# Patient Record
Sex: Male | Born: 1955 | Race: Asian | Hispanic: No | Smoking: Never smoker
Health system: Southern US, Community
[De-identification: ages and names within clinical notes are randomized; demographics above are authoritative.]

## PROBLEM LIST (undated history)

## (undated) DIAGNOSIS — E119 Type 2 diabetes mellitus without complications: Secondary | ICD-10-CM

## (undated) DIAGNOSIS — K859 Acute pancreatitis without necrosis or infection, unspecified: Secondary | ICD-10-CM

## (undated) DIAGNOSIS — Z87442 Personal history of urinary calculi: Secondary | ICD-10-CM

## (undated) DIAGNOSIS — I10 Essential (primary) hypertension: Secondary | ICD-10-CM

## (undated) DIAGNOSIS — E7800 Pure hypercholesterolemia, unspecified: Secondary | ICD-10-CM

## (undated) HISTORY — PX: OTHER SURGICAL HISTORY: SHX169

---

## 1999-10-08 ENCOUNTER — Encounter: Admission: RE | Admit: 1999-10-08 | Discharge: 1999-10-08 | Payer: Self-pay | Admitting: Internal Medicine

## 1999-10-08 ENCOUNTER — Encounter: Payer: Self-pay | Admitting: Internal Medicine

## 2000-10-10 ENCOUNTER — Encounter: Admission: RE | Admit: 2000-10-10 | Discharge: 2000-10-10 | Payer: Self-pay | Admitting: Internal Medicine

## 2000-10-10 ENCOUNTER — Encounter: Payer: Self-pay | Admitting: Internal Medicine

## 2003-10-02 ENCOUNTER — Ambulatory Visit (HOSPITAL_COMMUNITY): Admission: RE | Admit: 2003-10-02 | Discharge: 2003-10-02 | Payer: Self-pay | Admitting: *Deleted

## 2003-10-02 ENCOUNTER — Encounter (INDEPENDENT_AMBULATORY_CARE_PROVIDER_SITE_OTHER): Payer: Self-pay | Admitting: Specialist

## 2007-01-06 ENCOUNTER — Ambulatory Visit: Payer: Self-pay | Admitting: Oncology

## 2007-01-20 LAB — CBC WITH DIFFERENTIAL/PLATELET
BASO%: 0.5 % (ref 0.0–2.0)
Basophils Absolute: 0 10*3/uL (ref 0.0–0.1)
EOS%: 0.6 % (ref 0.0–7.0)
Eosinophils Absolute: 0 10*3/uL (ref 0.0–0.5)
HCT: 46 % (ref 38.7–49.9)
HGB: 16.4 g/dL (ref 13.0–17.1)
LYMPH%: 16.4 % (ref 14.0–48.0)
MCH: 31.4 pg (ref 28.0–33.4)
MCHC: 35.7 g/dL (ref 32.0–35.9)
MCV: 87.9 fL (ref 81.6–98.0)
MONO#: 0.4 10*3/uL (ref 0.1–0.9)
MONO%: 7.2 % (ref 0.0–13.0)
NEUT#: 4.6 10*3/uL (ref 1.5–6.5)
NEUT%: 75.3 % — ABNORMAL HIGH (ref 40.0–75.0)
Platelets: 193 10*3/uL (ref 145–400)
RBC: 5.23 10*6/uL (ref 4.20–5.71)
RDW: 12.9 % (ref 11.2–14.6)
WBC: 6.1 10*3/uL (ref 4.0–10.0)
lymph#: 1 10*3/uL (ref 0.9–3.3)

## 2007-01-24 LAB — COMPREHENSIVE METABOLIC PANEL
ALT: 25 U/L (ref 0–53)
AST: 27 U/L (ref 0–37)
Albumin: 4.6 g/dL (ref 3.5–5.2)
Alkaline Phosphatase: 49 U/L (ref 39–117)
BUN: 16 mg/dL (ref 6–23)
CO2: 28 mEq/L (ref 19–32)
Calcium: 9.4 mg/dL (ref 8.4–10.5)
Chloride: 102 mEq/L (ref 96–112)
Creatinine, Ser: 1.13 mg/dL (ref 0.40–1.50)
Glucose, Bld: 115 mg/dL — ABNORMAL HIGH (ref 70–99)
Potassium: 3.9 mEq/L (ref 3.5–5.3)
Sodium: 138 mEq/L (ref 135–145)
Total Bilirubin: 0.8 mg/dL (ref 0.3–1.2)
Total Protein: 7.6 g/dL (ref 6.0–8.3)

## 2007-01-24 LAB — LACTATE DEHYDROGENASE: LDH: 143 U/L (ref 94–250)

## 2007-05-30 ENCOUNTER — Ambulatory Visit: Payer: Self-pay | Admitting: Oncology

## 2010-10-04 ENCOUNTER — Encounter: Payer: Self-pay | Admitting: Orthopedic Surgery

## 2016-10-12 ENCOUNTER — Other Ambulatory Visit: Payer: Self-pay | Admitting: Internal Medicine

## 2016-10-12 DIAGNOSIS — R109 Unspecified abdominal pain: Secondary | ICD-10-CM

## 2016-10-15 ENCOUNTER — Ambulatory Visit
Admission: RE | Admit: 2016-10-15 | Discharge: 2016-10-15 | Disposition: A | Discharge status: Home / Self Care | Payer: 59 | Source: Ambulatory Visit | Attending: Internal Medicine | Admitting: Internal Medicine

## 2016-10-15 DIAGNOSIS — R109 Unspecified abdominal pain: Secondary | ICD-10-CM

## 2019-05-07 ENCOUNTER — Emergency Department (HOSPITAL_COMMUNITY): Payer: 59

## 2019-05-07 ENCOUNTER — Other Ambulatory Visit: Payer: Self-pay

## 2019-05-07 ENCOUNTER — Inpatient Hospital Stay (HOSPITAL_COMMUNITY)
Admission: EM | Admit: 2019-05-07 | Discharge: 2019-05-22 | DRG: 438 | Disposition: A | Discharge status: Home / Self Care | Payer: 59 | Attending: Internal Medicine | Admitting: Internal Medicine

## 2019-05-07 ENCOUNTER — Encounter (HOSPITAL_COMMUNITY): Payer: Self-pay | Admitting: Emergency Medicine

## 2019-05-07 DIAGNOSIS — E7800 Pure hypercholesterolemia, unspecified: Secondary | ICD-10-CM | POA: Diagnosis present

## 2019-05-07 DIAGNOSIS — R109 Unspecified abdominal pain: Secondary | ICD-10-CM | POA: Diagnosis not present

## 2019-05-07 DIAGNOSIS — I10 Essential (primary) hypertension: Secondary | ICD-10-CM | POA: Diagnosis present

## 2019-05-07 DIAGNOSIS — R0602 Shortness of breath: Secondary | ICD-10-CM

## 2019-05-07 DIAGNOSIS — Z86711 Personal history of pulmonary embolism: Secondary | ICD-10-CM | POA: Diagnosis not present

## 2019-05-07 DIAGNOSIS — D72829 Elevated white blood cell count, unspecified: Secondary | ICD-10-CM | POA: Diagnosis not present

## 2019-05-07 DIAGNOSIS — E876 Hypokalemia: Secondary | ICD-10-CM | POA: Diagnosis present

## 2019-05-07 DIAGNOSIS — Z79899 Other long term (current) drug therapy: Secondary | ICD-10-CM

## 2019-05-07 DIAGNOSIS — K625 Hemorrhage of anus and rectum: Secondary | ICD-10-CM | POA: Diagnosis not present

## 2019-05-07 DIAGNOSIS — R0682 Tachypnea, not elsewhere classified: Secondary | ICD-10-CM | POA: Diagnosis not present

## 2019-05-07 DIAGNOSIS — Z7984 Long term (current) use of oral hypoglycemic drugs: Secondary | ICD-10-CM | POA: Diagnosis not present

## 2019-05-07 DIAGNOSIS — E785 Hyperlipidemia, unspecified: Secondary | ICD-10-CM | POA: Diagnosis present

## 2019-05-07 DIAGNOSIS — K269 Duodenal ulcer, unspecified as acute or chronic, without hemorrhage or perforation: Secondary | ICD-10-CM | POA: Diagnosis not present

## 2019-05-07 DIAGNOSIS — I2699 Other pulmonary embolism without acute cor pulmonale: Secondary | ICD-10-CM | POA: Diagnosis not present

## 2019-05-07 DIAGNOSIS — Z20828 Contact with and (suspected) exposure to other viral communicable diseases: Secondary | ICD-10-CM | POA: Diagnosis present

## 2019-05-07 DIAGNOSIS — K265 Chronic or unspecified duodenal ulcer with perforation: Secondary | ICD-10-CM | POA: Diagnosis present

## 2019-05-07 DIAGNOSIS — Z8719 Personal history of other diseases of the digestive system: Secondary | ICD-10-CM | POA: Diagnosis not present

## 2019-05-07 DIAGNOSIS — R509 Fever, unspecified: Secondary | ICD-10-CM | POA: Diagnosis not present

## 2019-05-07 DIAGNOSIS — E118 Type 2 diabetes mellitus with unspecified complications: Secondary | ICD-10-CM | POA: Diagnosis not present

## 2019-05-07 DIAGNOSIS — E119 Type 2 diabetes mellitus without complications: Secondary | ICD-10-CM | POA: Diagnosis not present

## 2019-05-07 DIAGNOSIS — R6510 Systemic inflammatory response syndrome (SIRS) of non-infectious origin without acute organ dysfunction: Secondary | ICD-10-CM | POA: Diagnosis not present

## 2019-05-07 DIAGNOSIS — I2694 Multiple subsegmental pulmonary emboli without acute cor pulmonale: Secondary | ICD-10-CM | POA: Diagnosis not present

## 2019-05-07 DIAGNOSIS — R748 Abnormal levels of other serum enzymes: Secondary | ICD-10-CM | POA: Diagnosis not present

## 2019-05-07 DIAGNOSIS — K859 Acute pancreatitis without necrosis or infection, unspecified: Principal | ICD-10-CM | POA: Diagnosis present

## 2019-05-07 DIAGNOSIS — R140 Abdominal distension (gaseous): Secondary | ICD-10-CM

## 2019-05-07 HISTORY — DX: Pure hypercholesterolemia, unspecified: E78.00

## 2019-05-07 HISTORY — DX: Essential (primary) hypertension: I10

## 2019-05-07 HISTORY — DX: Type 2 diabetes mellitus without complications: E11.9

## 2019-05-07 LAB — COMPREHENSIVE METABOLIC PANEL
ALT: 378 U/L — ABNORMAL HIGH (ref 0–44)
AST: 514 U/L — ABNORMAL HIGH (ref 15–41)
Albumin: 4 g/dL (ref 3.5–5.0)
Alkaline Phosphatase: 91 U/L (ref 38–126)
Anion gap: 9 (ref 5–15)
BUN: 15 mg/dL (ref 8–23)
CO2: 25 mmol/L (ref 22–32)
Calcium: 9.2 mg/dL (ref 8.9–10.3)
Chloride: 102 mmol/L (ref 98–111)
Creatinine, Ser: 1 mg/dL (ref 0.61–1.24)
GFR calc Af Amer: >60 mL/min (ref >60)
GFR calc non Af Amer: >60 mL/min (ref >60)
Glucose, Bld: 158 mg/dL — ABNORMAL HIGH (ref 70–99)
Potassium: 3.7 mmol/L (ref 3.5–5.1)
Sodium: 136 mmol/L (ref 135–145)
Total Bilirubin: 3.1 mg/dL — ABNORMAL HIGH (ref 0.3–1.2)
Total Protein: 7.7 g/dL (ref 6.5–8.1)

## 2019-05-07 LAB — URINALYSIS, ROUTINE W REFLEX MICROSCOPIC
Bacteria, UA: NONE SEEN
Bilirubin Urine: NEGATIVE
Glucose, UA: NEGATIVE mg/dL
Hgb urine dipstick: NEGATIVE
Ketones, ur: 5 mg/dL — AB
Leukocytes,Ua: NEGATIVE
Nitrite: NEGATIVE
Protein, ur: 30 mg/dL — AB
Specific Gravity, Urine: >1.046 — ABNORMAL HIGH (ref 1.005–1.030)
pH: 5 (ref 5.0–8.0)

## 2019-05-07 LAB — CBC
HCT: 48.3 % (ref 39.0–52.0)
Hemoglobin: 16.6 g/dL (ref 13.0–17.0)
MCH: 30.2 pg (ref 26.0–34.0)
MCHC: 34.4 g/dL (ref 30.0–36.0)
MCV: 88 fL (ref 80.0–100.0)
Platelets: 223 10*3/uL (ref 150–400)
RBC: 5.49 MIL/uL (ref 4.22–5.81)
RDW: 12.4 % (ref 11.5–15.5)
WBC: 12.6 10*3/uL — ABNORMAL HIGH (ref 4.0–10.5)
nRBC: 0 % (ref 0.0–0.2)

## 2019-05-07 LAB — SARS CORONAVIRUS 2 BY RT PCR (HOSPITAL ORDER, PERFORMED IN ~~LOC~~ HOSPITAL LAB): SARS Coronavirus 2: NEGATIVE

## 2019-05-07 LAB — LIPASE, BLOOD: Lipase: 6889 U/L — ABNORMAL HIGH (ref 11–51)

## 2019-05-07 MED ORDER — ONDANSETRON HCL 4 MG/2ML IJ SOLN
4.0000 mg | Freq: Once | INTRAMUSCULAR | Status: AC | PRN
  Administered 2019-05-07: 16:00:00 4 mg via INTRAVENOUS
  Filled 2019-05-07: qty 2

## 2019-05-07 MED ORDER — SODIUM CHLORIDE 0.9% FLUSH
3.0000 mL | Freq: Once | INTRAVENOUS | Status: AC
  Administered 2019-05-07: 3 mL via INTRAVENOUS

## 2019-05-07 MED ORDER — FENTANYL CITRATE (PF) 100 MCG/2ML IJ SOLN
50.0000 ug | INTRAMUSCULAR | Status: AC | PRN
  Administered 2019-05-07 (×2): 50 ug via INTRAVENOUS
  Filled 2019-05-07 (×2): qty 2

## 2019-05-07 MED ORDER — FENTANYL CITRATE (PF) 100 MCG/2ML IJ SOLN
100.0000 ug | Freq: Once | INTRAMUSCULAR | Status: AC
  Administered 2019-05-07: 100 ug via INTRAVENOUS
  Filled 2019-05-07: qty 2

## 2019-05-07 MED ORDER — FENTANYL CITRATE (PF) 100 MCG/2ML IJ SOLN
50.0000 ug | Freq: Once | INTRAMUSCULAR | Status: AC
  Administered 2019-05-07: 50 ug via INTRAVENOUS
  Filled 2019-05-07: qty 2

## 2019-05-07 MED ORDER — IOHEXOL 300 MG/ML  SOLN
100.0000 mL | Freq: Once | INTRAMUSCULAR | Status: AC | PRN
  Administered 2019-05-07: 100 mL via INTRAVENOUS

## 2019-05-07 MED ORDER — SODIUM CHLORIDE 0.9 % IV SOLN
Freq: Once | INTRAVENOUS | Status: AC
  Administered 2019-05-07: 23:00:00 125 mL/h via INTRAVENOUS

## 2019-05-07 NOTE — ED Provider Notes (Addendum)
[facility] Provider Note   CSN: [id number] Arrival date & time: 05/07/19  1523     History   Chief Complaint Chief Complaint  Patient presents with  . Abdominal Pain    HPI Mark Kidd is a 63 y.o. male.     Epigastric pain intermittently for 2 weeks not associated with any activity.  No previous abdominal surgery.  Past medical history hypertension, diabetes, hypercholesterolemia.  Patient was seen by his primary care doctor and given omeprazole.  Severity of pain is moderate.  Nothing makes symptoms better or worse.     Past Medical History:  Diagnosis Date  . Diabetes mellitus without complication (HCC)    pre diabetic   . High cholesterol   . Hypertension     There are no active problems to display for this patient.   History reviewed. No pertinent surgical history.      Home Medications    Prior to Admission medications   Not on File    Family History No family history on file.  Social History Social History   Tobacco Use  . Smoking status: Never Smoker  . Smokeless tobacco: Never Used  Substance Use Topics  . Alcohol use: Yes    Comment: every other day drinks beer   . Drug use: Never     Allergies   Patient has no known allergies.   Review of Systems Review of Systems  All other systems reviewed and are negative.    Physical Exam Updated Vital Signs BP (!) 147/95   Pulse 73   Resp (!) 33   Ht 5\' 11"  (1.803 m)   Wt 86.2 kg   SpO2 93%   BMI 26.50 kg/m   Physical Exam Vitals signs and nursing note reviewed.  Constitutional:      Comments: In pain  HENT:     Head: Normocephalic and atraumatic.  Eyes:     Conjunctiva/sclera: Conjunctivae normal.  Neck:     Musculoskeletal: Neck supple.  Cardiovascular:     Rate and Rhythm: Normal rate and regular rhythm.  Pulmonary:     Effort: Pulmonary effort is normal.     Breath sounds: Normal breath sounds.  Abdominal:     General: Bowel sounds are normal.    Palpations: Abdomen is soft.     Comments: Bloated, tender epigastrium  Musculoskeletal: Normal range of motion.  Skin:    General: Skin is warm and dry.  Neurological:     Mental Status: He is alert and oriented to person, place, and time.  Psychiatric:        Behavior: Behavior normal.      ED Treatments / Results  Labs (all labs ordered are listed, but only abnormal results are displayed) Labs Reviewed  LIPASE, BLOOD - Abnormal; Notable for the following components:      Result Value   Lipase 6,889 (*)    All other components within normal limits  COMPREHENSIVE METABOLIC PANEL - Abnormal; Notable for the following components:   Glucose, Bld 158 (*)    AST 514 (*)    ALT 378 (*)    Total Bilirubin 3.1 (*)    All other components within normal limits  CBC - Abnormal; Notable for the following components:   WBC 12.6 (*)    All other components within normal limits  URINALYSIS, ROUTINE W REFLEX MICROSCOPIC    EKG None  Radiology Ct Abdomen Pelvis W Contrast  Result Date: 05/07/2019 CLINICAL DATA:  Abdominal pain,  elevated LFTs, epigastric pain EXAM: CT ABDOMEN AND PELVIS WITH CONTRAST TECHNIQUE: Multidetector CT imaging of the abdomen and pelvis was performed using the standard protocol following bolus administration of intravenous contrast. CONTRAST:  100mL OMNIPAQUE IOHEXOL 300 MG/ML  SOLN COMPARISON:  Abdominal ultrasound 10/15/2016 FINDINGS: Lower chest: Atelectatic changes are present in the lung bases. Normal heart size. No pericardial effusion. Hepatobiliary: No focal liver abnormality is seen. Small amount fluid within the gallbladder fossa. No frank gallbladder wall thickening. There is a punctate calcification near the level of the duodenum but this is unclear whether not this could reflect a distal CBD/ampullary stone. Pancreas: There are extensive retroperitoneal inflammatory changes however these do not appear focally centered upon the pancreas. The pancreas only  appears only slightly edematous. No pancreatic ductal dilatation. Spleen: Normal in size without focal abnormality. Adrenals/Urinary Tract: Normal adrenal glands. Mild bilateral nonspecific perinephric stranding, a nonspecific finding though may correlate with either age or decreased renal function. Kidneys are otherwise unremarkable, without renal calculi, suspicious lesion, or hydronephrosis. Bladder is unremarkable. Stomach/Bowel: Distal esophagus is normal. The gastric antrum appears slightly thickened. There is mucosal hyperenhancement circumferential thickening of the second and third portions of the duodenal sweep with a questionable focus of mural hypoattenuation (2/45) which may reflect a perforated duodenal ulcer. Remainder of the small bowel is unremarkable. A normal appendix is visualized. No colonic dilatation or wall thickening. Vascular/Lymphatic: Atherosclerotic plaque within the normal caliber aorta. Reactive adenopathy in the upper abdomen. No suspicious or enlarged lymph nodes in the included lymphatic chains. Reproductive: The prostate and seminal vesicles are unremarkable. Other: No abdominopelvic free fluid or free gas. No bowel containing hernias. Musculoskeletal: Multilevel degenerative changes are present in the imaged portions of the spine. Findings are maximal at L5-S1 with slight retrolisthesis. Features of Baastrup's disease. IMPRESSION: 1. Extensive retroperitoneal inflammatory changes appear largely centered upon the thickened and hyperenhancing second and third portions of the duodenal sweep with a questionable focus of mural hypoattenuation (2/45) which is highly suspicious for a perforated duodenal ulcer. 2. There are additional peripancreatic inflammatory features which could reflect a reactive inflammatory process from the adjacent duodenal inflammation or an intrinsic acute edematous pancreatitis. 3. Small amount of pericholecystic fluid without gallbladder wall thickening or  visible intraluminal gallstones. A punctate calcification is seen in the duodenum near the ampulla, unclear if this could reflect an ampullary stone. Could consider further evaluation with right upper quadrant ultrasound. 4. Mild bilateral nonspecific perinephric stranding, a nonspecific finding though may correlate with either age or decreased renal function. 5. Aortic Atherosclerosis (ICD10-I70.0). Electronically Signed   By: Kreg ShropshirePrice  DeHay M.D.   On: 05/07/2019 20:53    Procedures Procedures (including critical care time)  Medications Ordered in ED Medications  fentaNYL (SUBLIMAZE) injection 50 mcg (50 mcg Intravenous Given 05/07/19 1600)  sodium chloride flush (NS) 0.9 % injection 3 mL (3 mLs Intravenous Given 05/07/19 1601)  ondansetron (ZOFRAN) injection 4 mg (4 mg Intravenous Given 05/07/19 1601)  fentaNYL (SUBLIMAZE) injection 50 mcg (50 mcg Intravenous Given 05/07/19 1733)  fentaNYL (SUBLIMAZE) injection 100 mcg (100 mcg Intravenous Given 05/07/19 1928)  iohexol (OMNIPAQUE) 300 MG/ML solution 100 mL (100 mLs Intravenous Contrast Given 05/07/19 2005)     Initial Impression / Assessment and Plan / ED Course  I have reviewed the triage vital signs and the nursing notes.  Pertinent labs & imaging results that were available during my care of the patient were reviewed by me and considered in my medical decision making (see chart  for details).        Epigastric pain for 2 weeks.  White count minimally elevated. Lipase greater than 6000 with associated pan elevation of liver functions.  Alkaline phosphatase normal.  CT scan abnormal.  Will discuss with general surgery and gastroenterology at [facility].   It was recommended by the local specialists to transfer the patient to [facility].  I discussed the case with general surgery and gastroenterology at [facility]  CRITICAL CARE Performed by: Nat Christen Total critical care time: 80 minutes Critical care time was exclusive of separately  billable procedures and treating other patients. Critical care was necessary to treat or prevent imminent or life-threatening deterioration. Critical care was time spent personally by me on the following activities: development of treatment plan with patient and/or surrogate as well as nursing, discussions with consultants, evaluation of patient's response to treatment, examination of patient, obtaining history from patient or surrogate, ordering and performing treatments and interventions, ordering and review of laboratory studies, ordering and review of radiographic studies, pulse oximetry and re-evaluation of patient's condition. Final Clinical Impressions(s) / ED Diagnoses   Final diagnoses:  Elevated lipase    ED Discharge Orders    None       Nat Christen, MD 05/07/19 2126    Nat Christen, MD 05/07/19 [phone]

## 2019-05-07 NOTE — ED Triage Notes (Signed)
Epigastric pain x 2 weeks ago.  Seen at pcp, had ekg and blood work, given omeprazole.  No CT or ultrasound done.

## 2019-05-07 NOTE — H&P (Signed)
TRH H&P    Patient Demographics:    Mark Kidd, is a 63 y.o. male  MRN: [id number]  DOB - Apr 26, 1956  Admit Date - 05/07/2019  Referring MD/NP/PA: Nat Christen  Outpatient Primary MD for the patient is Lorene Dy, MD  Patient coming from: Home  Chief complaint-abdominal pain   HPI:    Mark Kidd  is a 63 y.o. male, with history of diabetes mellitus type 2, hyperlipidemia, hypertension who came to hospital with epigastric pain which started last night.  Patient says that pain was very severe and kind of eased off around 1 AM.  In the morning though he felt better.  As soon as he ate food the pain returned after 1 hour.  He was also sweating and in extreme discomfort due to pain.  Patient says that he had a similar pain around August 10 at that time he was seen by his PCP and was prescribed omeprazole which did not help the pain.  He had one episode of vomiting in the hospital. He denies vomiting any blood. Denies black stool. Denies blood in the stool. Denies previous history of gallstones. Denies chest pain or shortness of breath. Denies fever or chills  In the ED CT scan of the abdomen pelvis showed marked retroperitoneal inflammatory changes around second and third part of duodenum concerning for perforated peptic ulcer, also surrounding inflammation of pancreas.  Lipase significantly elevated to 6000, calcification noted at ampulla of greater concern for stone.  Both GI, Nandigam  and general surgery Dr Dema Severin were consulted at [facility] by Dr. Lacinda Axon.  They both will see patient as consult when patient reaches [facility].  No urgent surgery recommended as per conversation of Dr. Lacinda Axon and Dr. Dema Severin.    Review of systems:    In addition to the HPI above,    All other systems reviewed and are negative.    Past History of the following :    Past Medical History:  Diagnosis Date  . Diabetes mellitus  without complication ([place])    pre diabetic   . High cholesterol   . Hypertension       History reviewed. No pertinent surgical history.    Social History:      Social History   Tobacco Use  . Smoking status: Never Smoker  . Smokeless tobacco: Never Used  Substance Use Topics  . Alcohol use: Yes    Comment: every other day drinks beer        Family History :   No family history of cancer   Home Medications:   Prior to Admission medications   Medication Sig Start Date End Date Taking? Authorizing Provider  carvedilol (COREG) 6.25 MG tablet Take 6.25 mg by mouth every evening.   Yes [provider]  metFORMIN (GLUCOPHAGE) 500 MG tablet Take 500 mg by mouth every evening.    Yes [provider]  simvastatin (ZOCOR) 20 MG tablet Take 20 mg by mouth every evening.    Yes [provider]     Allergies:  No Known Allergies   Physical Exam:   Vitals  Blood pressure (!) 151/95, pulse 71, temperature 98.4 F (36.9 C), temperature source Oral, resp. rate (!) 35, height 5' 11" (1.803 m), weight 86.2 kg, SpO2 95 %.  1.  General: Appears in no acute distress  2. Psychiatric: Alert, oriented x3, intact insight and judgment  3. Neurologic: Cranial nerves II through XII grossly intact, motor strength 5/5 in all extremities  4. HEENMT:  Atraumatic normocephalic, extraocular muscles are intact  5. Respiratory : Clear to auscultation bilaterally, no wheezing or crackles  6. Cardiovascular : S1-S2, regular, no murmur auscultated  7. Gastrointestinal:  Abdomen is soft, nontender, no organomegaly  8. Skin:  No rashes noted      Data Review:    CBC Recent Labs  Lab 05/07/19 1611  WBC 12.6*  HGB 16.6  HCT 48.3  PLT 223  MCV 88.0  MCH 30.2  MCHC 34.4  RDW 12.4   ------------------------------------------------------------------------------------------------------------------  Results for orders placed or performed during  the hospital encounter of 05/07/19 (from the past 48 hour(s))  Urinalysis, Routine w reflex microscopic     Status: Abnormal   Collection Time: 05/07/19  3:39 PM  Result Value Ref Range   Color, Urine AMBER (A) YELLOW    Comment: BIOCHEMICALS MAY BE AFFECTED BY COLOR   APPearance CLEAR CLEAR   Specific Gravity, Urine >1.046 (H) 1.005 - 1.030   pH 5.0 5.0 - 8.0   Glucose, UA NEGATIVE NEGATIVE mg/dL   Hgb urine dipstick NEGATIVE NEGATIVE   Bilirubin Urine NEGATIVE NEGATIVE   Ketones, ur 5 (A) NEGATIVE mg/dL   Protein, ur 30 (A) NEGATIVE mg/dL   Nitrite NEGATIVE NEGATIVE   Leukocytes,Ua NEGATIVE NEGATIVE   RBC / HPF 0-5 0 - 5 RBC/hpf   WBC, UA 0-5 0 - 5 WBC/hpf   Bacteria, UA NONE SEEN NONE SEEN   Squamous Epithelial / LPF 0-5 0 - 5   Mucus PRESENT     Comment: Performed at [facility], [address] [place], [place] [address]  Lipase, blood     Status: Abnormal   Collection Time: 05/07/19  4:11 PM  Result Value Ref Range   Lipase 6,889 (H) 11 - 51 U/L    Comment: RESULTS CONFIRMED BY MANUAL DILUTION Performed at [facility], [address], [place], [place] [address]   Comprehensive metabolic panel     Status: Abnormal   Collection Time: 05/07/19  4:11 PM  Result Value Ref Range   Sodium 136 135 - 145 mmol/L   Potassium 3.7 3.5 - 5.1 mmol/L   Chloride 102 98 - 111 mmol/L   CO2 25 22 - 32 mmol/L   Glucose, Bld 158 (H) 70 - 99 mg/dL   BUN 15 8 - 23 mg/dL   Creatinine, Ser 1.00 0.61 - 1.24 mg/dL   Calcium 9.2 8.9 - 10.3 mg/dL   Total Protein 7.7 6.5 - 8.1 g/dL   Albumin 4.0 3.5 - 5.0 g/dL   AST 514 (H) 15 - 41 U/L   ALT 378 (H) 0 - 44 U/L   Alkaline Phosphatase 91 38 - 126 U/L   Total Bilirubin 3.1 (H) 0.3 - 1.2 mg/dL   GFR calc non Af Amer >60 >60 mL/min   GFR calc Af Amer >60 >60 mL/min   Anion gap 9 5 - 15    Comment: Performed at [facility], [address], [place], [place] [address]  CBC     Status: Abnormal  Collection Time: 05/07/19  4:11 PM  Result Value Ref  Range   WBC 12.6 (H) 4.0 - 10.5 K/uL   RBC 5.49 4.22 - 5.81 MIL/uL   Hemoglobin 16.6 13.0 - 17.0 g/dL   HCT 48.3 39.0 - 52.0 %   MCV 88.0 80.0 - 100.0 fL   MCH 30.2 26.0 - 34.0 pg   MCHC 34.4 30.0 - 36.0 g/dL   RDW 12.4 11.5 - 15.5 %   Platelets 223 150 - 400 K/uL   nRBC 0.0 0.0 - 0.2 %    Comment: Performed at [facility], [address] [place], [place] [address]  SARS Coronavirus 2 [facility] order, Performed in [facility] hospital lab) Nasopharyngeal Nasopharyngeal Swab     Status: None   Collection Time: 05/07/19  9:33 PM   Specimen: Nasopharyngeal Swab  Result Value Ref Range   SARS Coronavirus 2 NEGATIVE NEGATIVE    Comment: (NOTE) If result is NEGATIVE SARS-CoV-2 target nucleic acids are NOT DETECTED. The SARS-CoV-2 RNA is generally detectable in upper and lower  respiratory specimens during the acute phase of infection. The lowest  concentration of SARS-CoV-2 viral copies this assay can detect is 250  copies / mL. A negative result does not preclude SARS-CoV-2 infection  and should not be used as the sole basis for treatment or other  patient management decisions.  A negative result may occur with  improper specimen collection / handling, submission of specimen other  than nasopharyngeal swab, presence of viral mutation(s) within the  areas targeted by this assay, and inadequate number of viral copies  (<250 copies / mL). A negative result must be combined with clinical  observations, patient history, and epidemiological information. If result is POSITIVE SARS-CoV-2 target nucleic acids are DETECTED. The SARS-CoV-2 RNA is generally detectable in upper and lower  respiratory specimens dur ing the acute phase of infection.  Positive  results are indicative of active infection with SARS-CoV-2.  Clinical  correlation with patient history and other diagnostic information is  necessary to determine patient infection status.  Positive results do  not rule out bacterial  infection or co-infection with other viruses. If result is PRESUMPTIVE POSTIVE SARS-CoV-2 nucleic acids MAY BE PRESENT.   A presumptive positive result was obtained on the submitted specimen  and confirmed on repeat testing.  While 2019 novel coronavirus  (SARS-CoV-2) nucleic acids may be present in the submitted sample  additional confirmatory testing may be necessary for epidemiological  and / or clinical management purposes  to differentiate between  SARS-CoV-2 and other Sarbecovirus currently known to infect humans.  If clinically indicated additional testing with an alternate test  methodology [phone]) is advised. The SARS-CoV-2 RNA is generally  detectable in upper and lower respiratory sp ecimens during the acute  phase of infection. The expected result is Negative. Fact Sheet for Patients:  StrictlyIdeas.no Fact Sheet for Healthcare Providers: BankingDealers.co.za This test is not yet approved or cleared by the [place] FDA and has been authorized for detection and/or diagnosis of SARS-CoV-2 by FDA under an Emergency Use Authorization (EUA).  This EUA will remain in effect (meaning this test can be used) for the duration of the COVID-19 declaration under Section 564(b)(1) of the Act, 21 U.S.C. section 360bbb-3(b)(1), unless the authorization is terminated or revoked sooner. Performed at [facility], [address], [place], [place] [address]     Chemistries  Recent Labs  Lab 05/07/19 1611  NA 136  K 3.7  CL 102  CO2 25  GLUCOSE 158*  BUN 15  CREATININE 1.00  CALCIUM 9.2  AST 514*  ALT 378*  ALKPHOS 91  BILITOT 3.1*   ------------------------------------------------------------------------------------------------------------------  ------------------------------------------------------------------------------------------------------------------ GFR: Estimated Creatinine Clearance: 81.6 mL/min (by C-G formula  based on SCr of 1 mg/dL). Liver Function Tests: Recent Labs  Lab 05/07/19 1611  AST 514*  ALT 378*  ALKPHOS 91  BILITOT 3.1*  PROT 7.7  ALBUMIN 4.0   Recent Labs  Lab 05/07/19 1611  LIPASE 6,889*    --------------------------------------------------------------------------------------------------------------- Urine analysis:    Component Value Date/Time   COLORURINE AMBER (A) 05/07/2019 1539   APPEARANCEUR CLEAR 05/07/2019 1539   LABSPEC >1.046 (H) 05/07/2019 1539   PHURINE 5.0 05/07/2019 1539   GLUCOSEU NEGATIVE 05/07/2019 1539   HGBUR NEGATIVE 05/07/2019 1539   BILIRUBINUR NEGATIVE 05/07/2019 1539   KETONESUR 5 (A) 05/07/2019 1539   PROTEINUR 30 (A) 05/07/2019 1539   NITRITE NEGATIVE 05/07/2019 1539   LEUKOCYTESUR NEGATIVE 05/07/2019 1539      Imaging Results:    Ct Abdomen Pelvis W Contrast  Result Date: 05/07/2019 CLINICAL DATA:  Abdominal pain, elevated LFTs, epigastric pain EXAM: CT ABDOMEN AND PELVIS WITH CONTRAST TECHNIQUE: Multidetector CT imaging of the abdomen and pelvis was performed using the standard protocol following bolus administration of intravenous contrast. CONTRAST:  168m OMNIPAQUE IOHEXOL 300 MG/ML  SOLN COMPARISON:  Abdominal ultrasound 10/15/2016 FINDINGS: Lower chest: Atelectatic changes are present in the lung bases. Normal heart size. No pericardial effusion. Hepatobiliary: No focal liver abnormality is seen. Small amount fluid within the gallbladder fossa. No frank gallbladder wall thickening. There is a punctate calcification near the level of the duodenum but this is unclear whether not this could reflect a distal CBD/ampullary stone. Pancreas: There are extensive retroperitoneal inflammatory changes however these do not appear focally centered upon the pancreas. The pancreas only appears only slightly edematous. No pancreatic ductal dilatation. Spleen: Normal in size without focal abnormality. Adrenals/Urinary Tract: Normal adrenal glands. Mild  bilateral nonspecific perinephric stranding, a nonspecific finding though may correlate with either age or decreased renal function. Kidneys are otherwise unremarkable, without renal calculi, suspicious lesion, or hydronephrosis. Bladder is unremarkable. Stomach/Bowel: Distal esophagus is normal. The gastric antrum appears slightly thickened. There is mucosal hyperenhancement circumferential thickening of the second and third portions of the duodenal sweep with a questionable focus of mural hypoattenuation (2/45) which may reflect a perforated duodenal ulcer. Remainder of the small bowel is unremarkable. A normal appendix is visualized. No colonic dilatation or wall thickening. Vascular/Lymphatic: Atherosclerotic plaque within the normal caliber aorta. Reactive adenopathy in the upper abdomen. No suspicious or enlarged lymph nodes in the included lymphatic chains. Reproductive: The prostate and seminal vesicles are unremarkable. Other: No abdominopelvic free fluid or free gas. No bowel containing hernias. Musculoskeletal: Multilevel degenerative changes are present in the imaged portions of the spine. Findings are maximal at L5-S1 with slight retrolisthesis. Features of Baastrup's disease. IMPRESSION: 1. Extensive retroperitoneal inflammatory changes appear largely centered upon the thickened and hyperenhancing second and third portions of the duodenal sweep with a questionable focus of mural hypoattenuation (2/45) which is highly suspicious for a perforated duodenal ulcer. 2. There are additional peripancreatic inflammatory features which could reflect a reactive inflammatory process from the adjacent duodenal inflammation or an intrinsic acute edematous pancreatitis. 3. Small amount of pericholecystic fluid without gallbladder wall thickening or visible intraluminal gallstones. A punctate calcification is seen in the duodenum near the ampulla, unclear if this could reflect an ampullary stone. Could consider  further evaluation with  right upper quadrant ultrasound. 4. Mild bilateral nonspecific perinephric stranding, a nonspecific finding though may correlate with either age or decreased renal function. 5. Aortic Atherosclerosis (ICD10-I70.0). Electronically Signed   By: Lovena Le M.D.   On: 05/07/2019 20:53       Assessment & Plan:    Active Problems:   Pancreatitis   1. ? Perforated duodenal ulcer-as per CT scan abdomen pelvis as above, patient is clinically stable.  Vitals are stable, does not have rigidity or guarding on abdominal examination.  General surgery has been consulted by ED physician, Dr. Dema Severin will see patient when patient arrives at [facility].  We will keep him n.p.o., fentanyl 50 mcg every 4 hours as needed, IV normal saline at 125 mill per hour.  2. Acute pancreatitis-lipase significantly elevated 6889, AST 514, ALT 378, alk phos 91, total bilirubin 3.1. CT abdomen also shows punctate calcification in the duodenum near the ampulla which could reflect an ampullary stone.  GI has been consulted.  Dr. Silverio Decamp will see patient in a.m.  Patient will be kept n.p.o. as above.  Pain control and IV fluids as above.  3. Diabetes mellitus type 2-we will initiate sliding scale insulin with NovoLog. Check CBG every 4 hours.  Metformin on hold. 4. Hypertension-blood pressure stable, patient takes Coreg 6.25 mg every evening.  Will start metoprolol 2.5 mg IV every 8 hours    DVT Prophylaxis-   Lovenox   AM Labs Ordered, also please review Full Orders  Family Communication: Admission, patients condition and plan of care including tests being ordered have been discussed with the patient who indicate understanding and agree with the plan and Code Status.  Code Status: Full code  Admission status: Inpatient: Based on patients clinical presentation and evaluation of above clinical data, I have made determination that patient meets Inpatient criteria at this time.  Time spent in minutes :  60 minutes   Oswald Hillock M.D on 05/07/2019 at 11:27 PM

## 2019-05-07 NOTE — ED Notes (Signed)
Patient transported to CT 

## 2019-05-07 NOTE — ED Notes (Signed)
Pt has been informed we need urine sample.

## 2019-05-08 ENCOUNTER — Inpatient Hospital Stay (HOSPITAL_COMMUNITY): Payer: 59

## 2019-05-08 DIAGNOSIS — E118 Type 2 diabetes mellitus with unspecified complications: Secondary | ICD-10-CM

## 2019-05-08 DIAGNOSIS — K269 Duodenal ulcer, unspecified as acute or chronic, without hemorrhage or perforation: Secondary | ICD-10-CM | POA: Diagnosis present

## 2019-05-08 DIAGNOSIS — I10 Essential (primary) hypertension: Secondary | ICD-10-CM | POA: Diagnosis present

## 2019-05-08 DIAGNOSIS — E119 Type 2 diabetes mellitus without complications: Secondary | ICD-10-CM

## 2019-05-08 LAB — COMPREHENSIVE METABOLIC PANEL
ALT: 497 U/L — ABNORMAL HIGH (ref 0–44)
AST: 502 U/L — ABNORMAL HIGH (ref 15–41)
Albumin: 3.7 g/dL (ref 3.5–5.0)
Alkaline Phosphatase: 162 U/L — ABNORMAL HIGH (ref 38–126)
Anion gap: 13 (ref 5–15)
BUN: 17 mg/dL (ref 8–23)
CO2: 22 mmol/L (ref 22–32)
Calcium: 8.9 mg/dL (ref 8.9–10.3)
Chloride: 104 mmol/L (ref 98–111)
Creatinine, Ser: 0.97 mg/dL (ref 0.61–1.24)
GFR calc Af Amer: >60 mL/min (ref >60)
GFR calc non Af Amer: >60 mL/min (ref >60)
Glucose, Bld: 156 mg/dL — ABNORMAL HIGH (ref 70–99)
Potassium: 3.9 mmol/L (ref 3.5–5.1)
Sodium: 139 mmol/L (ref 135–145)
Total Bilirubin: 7 mg/dL — ABNORMAL HIGH (ref 0.3–1.2)
Total Protein: 7.2 g/dL (ref 6.5–8.1)

## 2019-05-08 LAB — HIV ANTIBODY (ROUTINE TESTING W REFLEX): HIV Screen 4th Generation wRfx: NONREACTIVE

## 2019-05-08 LAB — GLUCOSE, CAPILLARY
Glucose-Capillary: 173 mg/dL — ABNORMAL HIGH (ref 70–99)
Glucose-Capillary: 156 mg/dL — ABNORMAL HIGH (ref 70–99)
Glucose-Capillary: 131 mg/dL — ABNORMAL HIGH (ref 70–99)
Glucose-Capillary: 123 mg/dL — ABNORMAL HIGH (ref 70–99)
Glucose-Capillary: 150 mg/dL — ABNORMAL HIGH (ref 70–99)

## 2019-05-08 LAB — CBC
HCT: 51.6 % (ref 39.0–52.0)
Hemoglobin: 18 g/dL — ABNORMAL HIGH (ref 13.0–17.0)
MCH: 30.6 pg (ref 26.0–34.0)
MCHC: 34.9 g/dL (ref 30.0–36.0)
MCV: 87.8 fL (ref 80.0–100.0)
Platelets: 204 10*3/uL (ref 150–400)
RBC: 5.88 MIL/uL — ABNORMAL HIGH (ref 4.22–5.81)
RDW: 12.5 % (ref 11.5–15.5)
WBC: 15.5 10*3/uL — ABNORMAL HIGH (ref 4.0–10.5)
nRBC: 0 % (ref 0.0–0.2)

## 2019-05-08 LAB — HEMOGLOBIN A1C
Hgb A1c MFr Bld: 6.4 % — ABNORMAL HIGH (ref 4.8–5.6)
Mean Plasma Glucose: 136.98 mg/dL

## 2019-05-08 LAB — LIPASE, BLOOD: Lipase: 1526 U/L — ABNORMAL HIGH (ref 11–51)

## 2019-05-08 MED ORDER — HYDROMORPHONE HCL 1 MG/ML IJ SOLN
1.0000 mg | INTRAMUSCULAR | Status: DC | PRN
  Administered 2019-05-08 – 2019-05-12 (×16): 1 mg via INTRAVENOUS
  Filled 2019-05-08 (×17): qty 1

## 2019-05-08 MED ORDER — ACETAMINOPHEN 325 MG PO TABS
650.0000 mg | ORAL_TABLET | Freq: Four times a day (QID) | ORAL | Status: DC | PRN
  Administered 2019-05-14 – 2019-05-21 (×8): 650 mg via ORAL
  Filled 2019-05-08 (×10): qty 2

## 2019-05-08 MED ORDER — ACETAMINOPHEN 650 MG RE SUPP: 650.0000 mg | Freq: Four times a day (QID) | RECTAL | Status: DC | PRN

## 2019-05-08 MED ORDER — FENTANYL CITRATE (PF) 100 MCG/2ML IJ SOLN
50.0000 ug | INTRAMUSCULAR | Status: DC | PRN
  Administered 2019-05-08 – 2019-05-12 (×13): 50 ug via INTRAVENOUS
  Filled 2019-05-08 (×13): qty 2

## 2019-05-08 MED ORDER — PANTOPRAZOLE SODIUM 40 MG IV SOLR: 40.0000 mg | Freq: Two times a day (BID) | INTRAVENOUS | Status: DC

## 2019-05-08 MED ORDER — ONDANSETRON HCL 4 MG PO TABS: 4.0000 mg | ORAL_TABLET | Freq: Four times a day (QID) | ORAL | Status: DC | PRN

## 2019-05-08 MED ORDER — ENOXAPARIN SODIUM 40 MG/0.4ML ~~LOC~~ SOLN
40.0000 mg | SUBCUTANEOUS | Status: DC
  Administered 2019-05-08 – 2019-05-15 (×8): 40 mg via SUBCUTANEOUS
  Filled 2019-05-08 (×8): qty 0.4

## 2019-05-08 MED ORDER — FENTANYL CITRATE (PF) 100 MCG/2ML IJ SOLN
50.0000 ug | Freq: Once | INTRAMUSCULAR | Status: AC
  Administered 2019-05-08: 02:00:00 50 ug via INTRAVENOUS
  Filled 2019-05-08: qty 2

## 2019-05-08 MED ORDER — PANTOPRAZOLE SODIUM 40 MG IV SOLR
40.0000 mg | Freq: Two times a day (BID) | INTRAVENOUS | Status: DC
  Administered 2019-05-08 – 2019-05-13 (×11): 40 mg via INTRAVENOUS
  Filled 2019-05-08 (×11): qty 40

## 2019-05-08 MED ORDER — INSULIN ASPART 100 UNIT/ML ~~LOC~~ SOLN
0.0000 [IU] | SUBCUTANEOUS | Status: DC
  Administered 2019-05-08: 04:00:00 2 [IU] via SUBCUTANEOUS
  Administered 2019-05-08: 1 [IU] via SUBCUTANEOUS
  Administered 2019-05-08: 09:00:00 2 [IU] via SUBCUTANEOUS
  Administered 2019-05-08 – 2019-05-10 (×11): 1 [IU] via SUBCUTANEOUS
  Administered 2019-05-11: 2 [IU] via SUBCUTANEOUS
  Administered 2019-05-11: 09:00:00 1 [IU] via SUBCUTANEOUS
  Administered 2019-05-11 – 2019-05-12 (×2): 2 [IU] via SUBCUTANEOUS
  Administered 2019-05-12 (×2): 1 [IU] via SUBCUTANEOUS
  Administered 2019-05-12: 12:00:00 3 [IU] via SUBCUTANEOUS
  Administered 2019-05-13 (×2): 1 [IU] via SUBCUTANEOUS
  Administered 2019-05-13: 13:00:00 2 [IU] via SUBCUTANEOUS
  Administered 2019-05-13 (×2): 1 [IU] via SUBCUTANEOUS
  Administered 2019-05-13: 2 [IU] via SUBCUTANEOUS
  Administered 2019-05-13 – 2019-05-14 (×2): 1 [IU] via SUBCUTANEOUS
  Administered 2019-05-14 (×2): 2 [IU] via SUBCUTANEOUS
  Administered 2019-05-14 – 2019-05-15 (×3): 1 [IU] via SUBCUTANEOUS
  Administered 2019-05-15: 21:00:00 2 [IU] via SUBCUTANEOUS
  Administered 2019-05-16: 1 [IU] via SUBCUTANEOUS
  Administered 2019-05-16: 06:00:00 2 [IU] via SUBCUTANEOUS
  Administered 2019-05-16: 1 [IU] via SUBCUTANEOUS
  Administered 2019-05-16 – 2019-05-17 (×3): 2 [IU] via SUBCUTANEOUS
  Administered 2019-05-17 (×4): 1 [IU] via SUBCUTANEOUS
  Administered 2019-05-18: 2 [IU] via SUBCUTANEOUS
  Administered 2019-05-18 (×2): 1 [IU] via SUBCUTANEOUS
  Administered 2019-05-18 (×2): 2 [IU] via SUBCUTANEOUS

## 2019-05-08 MED ORDER — HYDROMORPHONE HCL 1 MG/ML IJ SOLN
1.0000 mg | Freq: Once | INTRAMUSCULAR | Status: AC
  Administered 2019-05-08: 03:00:00 1 mg via INTRAVENOUS
  Filled 2019-05-08: qty 1

## 2019-05-08 MED ORDER — SODIUM CHLORIDE 0.9 % IV SOLN
INTRAVENOUS | Status: DC
  Administered 2019-05-08 – 2019-05-13 (×16): via INTRAVENOUS

## 2019-05-08 MED ORDER — SUCRALFATE 1 GM/10ML PO SUSP
1.0000 g | Freq: Three times a day (TID) | ORAL | Status: DC
  Administered 2019-05-08 – 2019-05-15 (×28): 1 g via ORAL
  Filled 2019-05-08 (×28): qty 10

## 2019-05-08 MED ORDER — ONDANSETRON HCL 4 MG/2ML IJ SOLN: 4.0000 mg | Freq: Four times a day (QID) | INTRAMUSCULAR | Status: DC | PRN

## 2019-05-08 NOTE — Progress Notes (Signed)
Received patient transfer from [facility] ED via [place], Pt AOx4, VSS ambulatory, denies pain, oriented to room, bed controls, call light, plan of care and administered scheduled Insulin Novolog 2 units per order for CBG=173.  Text paged [facility] admission, Dr. Deland Pretty and Maryanna Shape GI on call of patient's arrival to the unit per request by [facility].  Pt. Now resting on with both eyes closed.  Will monitor.

## 2019-05-08 NOTE — Progress Notes (Signed)
PROGRESS NOTE  Kyshaun Barnette UXN:[id number] DOB: [birth year] DOA: 05/07/2019 PCP: Lorene Dy, MD  Brief History   Mark Kidd  is a 63 y.o. male, with history of diabetes mellitus type 2, hyperlipidemia, hypertension who came to hospital with epigastric pain which started last night.  Patient says that pain was very severe and kind of eased off around 1 AM.  In the morning though he felt better.  As soon as he ate food the pain returned after 1 hour.  He was also sweating and in extreme discomfort due to pain.  Patient says that he had a similar pain around August 10 at that time he was seen by his PCP and was prescribed omeprazole which did not help the pain.  He had one episode of vomiting in the hospital. He denies vomiting any blood. Denies black stool. Denies blood in the stool. Denies previous history of gallstones. Denies chest pain or shortness of breath. Denies fever or chills.  In the ED CT scan of the abdomen pelvis showed marked retroperitoneal inflammatory changes around second and third part of duodenum concerning for perforated peptic ulcer, also surrounding inflammation of pancreas.  Lipase significantly elevated to 6000, calcification noted at ampulla of greater concern for stone.  Both GI, Nandigam  and general surgery Dr Dema Severin were consulted at [facility] by Dr. Lacinda Axon.  They both will see patient as consult when patient reaches [facility].  No urgent surgery recommended as per conversation of Dr. Lacinda Axon and Dr. Dema Severin.  The patient has been admitted to a medical bed. He has continued to have abdominal pain. He has been seen by Dr. Dema Severin and Dr. Oletta Lamas.  Consultants  . General Surgery . Gastroenterology  Procedures  . None  Antibiotics   Anti-infectives (From admission, onward)   None    .  Subjective  The patient is resting in bed. He continues to complain of abdominal pain. He states that pain meds does not last long.  Objective   Vitals:  Vitals:   05/08/19 0300  05/08/19 0417  BP: (!) 149/92 (!) 153/95  Pulse: 73 80  Resp: (!) 27 20  Temp:  98 F (36.7 C)  SpO2: 93% 96%    Exam:  Constitutional:  . The patient is resting comfortably. No new complaints. Respiratory:  . No increased work of breathing. . No murmurs, ectopy or gallups. . No lateral PMI. No thrills. Cardiovascular:  . Regular rate and rhythm . No murmurs, ectopy, or gallups . No lateral PMI. No thrills. Abdomen:  . Abdomen appears normal; no tenderness or masses . No hernias, masses, or organomegaly. . Normoactive bowel sounds.  Musculoskeletal:  . No cyanosis, clubbing, or edema Skin:  . No rashes, lesions, ulcers . palpation of skin: no induration or nodules Neurologic:  . CN 2-12 intact . Sensation all 4 extremities intact Psychiatric:  . Mental status o Mood, affect appropriate o Orientation to person, place, time  . judgment and insight appear intact    I have personally reviewed the following:   Today's Data  . Vitals, CBC, CMP  Scheduled Meds: . enoxaparin (LOVENOX) injection  40 mg Subcutaneous Q24H  . insulin aspart  0-9 Units Subcutaneous Q4H  . pantoprazole (PROTONIX) IV  40 mg Intravenous Q12H  . sucralfate  1 g Oral TID WC & HS   Continuous Infusions: . sodium chloride 125 mL/hr at 05/08/19 0753    Problem  Duodenal Ulcer  DM II (Diabetes Mellitus, Type Ii), Controlled (Hcc)  Essential Hypertension  Pancreatitis     LOS: 1 day   A & P  Perforated duodenal ulcer-as per CT scan abdomen pelvis as above, patient is clinically stable.  Vitals are stable, does not have rigidity or guarding on abdominal examination.  General surgery has been consulted by ED physician, Dr. Dema Severin will see patient when patient arrives at [facility].  We will keep him n.p.o., dilaudid, IV normal saline at 125 mill per hour. General surgery wishes to treat this problem conservatively with close monitoring.  Acute pancreatitis-lipase significantly elevated 6889,  AST 514, ALT 378, alk phos 91, total bilirubin 3.1. CT abdomen also shows punctate calcification in the duodenum near the ampulla which could reflect an ampullary stone.  GI has been consulted.  GI has seen the patient.  Patient will be kept n.p.o. as above.  Pain control and IV fluids as above.  Diabetes mellitus type 2-we will initiate sliding scale insulin with NovoLog. Check CBG every 4 hours.  Metformin on hold.  Hypertension-blood pressure stable, patient takes Coreg 6.25 mg every evening.  Will start metoprolol 2.5 mg IV every 8 hours   I have seen and examined this patient myself. I have spent 35 minutes in his evaluation and care.  DVT Prophylaxis:  Lovenox  Family Communication: Admission, patients condition and plan of care including tests being ordered have been discussed with the patient who indicate understanding and agree with the plan and Code Status. Code Status: Full code Disposition: Home  Navjot Loera, DO [facility] Direct contact: see [email or web address]  7PM-7AM contact night coverage as above 05/08/2019, 2:18 PM  LOS: 1 day

## 2019-05-08 NOTE — H&P (Signed)
CC: Consult for possible biliary pancreatitis, additionally possible duodenal ulcer?  HPI: Mark Kidd is a very pleasant 63 y.o. male with hx of HTN, HLD, prediabetes whom reports that he has had rather persistent abdominal pain for the past 2 weeks concentrated in his midepigastrium.  He describes the pain as a pressure sensation and not so much as a sharp/cutting pain.  He also describes it as a somewhat burning sensation.  It does not radiate.  Nothing seems to make it better aside from pain medication.  Nothing seems to make it worse either.  He has had some associated nausea and vomiting.  He denies any fever/chills or changes in his bowel habits.   He works as an Architect and appears to have spent a fair amount of time internalizing his current symptoms.  He has had a history of kidney stones.  He states that his urologist told him the pain he experienced from a kidney stone was due to the back pressure behind it in a dilated ureter.  During our evaluation he queried whether or not he had a similar phenomenon occurring in his abdomen.  He states that the pain he experienced from the pressure of his kidney stone is very similar but located in his mid epigastrium instead of his flank.  In review of his records, he had an abdominal ultrasound performed 10/2016 which demonstrated tiny gallbladder polyps measuring up to 4 mm.  Past Medical History:  Diagnosis Date   Diabetes mellitus without complication ([place])    pre diabetic    High cholesterol    Hypertension     History reviewed. No pertinent surgical history.  No family history on file.  Social:  reports that he has never smoked. He has never used smokeless tobacco. He reports current alcohol use. He reports that he does not use drugs.  Allergies: No Known Allergies  Medications: I have reviewed the patient's current medications.  Results for orders placed or performed during the hospital encounter  of 05/07/19 (from the past 48 hour(s))  Urinalysis, Routine w reflex microscopic     Status: Abnormal   Collection Time: 05/07/19  3:39 PM  Result Value Ref Range   Color, Urine AMBER (A) YELLOW    Comment: BIOCHEMICALS MAY BE AFFECTED BY COLOR   APPearance CLEAR CLEAR   Specific Gravity, Urine >1.046 (H) 1.005 - 1.030   pH 5.0 5.0 - 8.0   Glucose, UA NEGATIVE NEGATIVE mg/dL   Hgb urine dipstick NEGATIVE NEGATIVE   Bilirubin Urine NEGATIVE NEGATIVE   Ketones, ur 5 (A) NEGATIVE mg/dL   Protein, ur 30 (A) NEGATIVE mg/dL   Nitrite NEGATIVE NEGATIVE   Leukocytes,Ua NEGATIVE NEGATIVE   RBC / HPF 0-5 0 - 5 RBC/hpf   WBC, UA 0-5 0 - 5 WBC/hpf   Bacteria, UA NONE SEEN NONE SEEN   Squamous Epithelial / LPF 0-5 0 - 5   Mucus PRESENT     Comment: Performed at [facility], [address], [place], [place] [address]  Lipase, blood     Status: Abnormal   Collection Time: 05/07/19  4:11 PM  Result Value Ref Range   Lipase 6,889 (H) 11 - 51 U/L    Comment: RESULTS CONFIRMED BY MANUAL DILUTION Performed at [facility], [address], [place], [place] [address]   Comprehensive metabolic panel     Status: Abnormal   Collection Time: 05/07/19  4:11 PM  Result Value Ref Range   Sodium 136 135 -  145 mmol/L   Potassium 3.7 3.5 - 5.1 mmol/L   Chloride 102 98 - 111 mmol/L   CO2 25 22 - 32 mmol/L   Glucose, Bld 158 (H) 70 - 99 mg/dL   BUN 15 8 - 23 mg/dL   Creatinine, Ser 1.191.00 0.61 - 1.24 mg/dL   Calcium 9.2 8.9 - 14.710.3 mg/dL   Total Protein 7.7 6.5 - 8.1 g/dL   Albumin 4.0 3.5 - 5.0 g/dL   AST 829514 (H) 15 - 41 U/L   ALT 378 (H) 0 - 44 U/L   Alkaline Phosphatase 91 38 - 126 U/L   Total Bilirubin 3.1 (H) 0.3 - 1.2 mg/dL   GFR calc non Af Amer >60 >60 mL/min   GFR calc Af Amer >60 >60 mL/min   Anion gap 9 5 - 15    Comment: Performed at [facility], [address] [place], [place] [id number]  CBC     Status: Abnormal   Collection Time: 05/07/19  4:11 PM  Result Value Ref Range   WBC 12.6 (H)  4.0 - 10.5 K/uL   RBC 5.49 4.22 - 5.81 MIL/uL   Hemoglobin 16.6 13.0 - 17.0 g/dL   HCT 08.648.3 57.839.0 - 46.952.0 %   MCV 88.0 80.0 - 100.0 fL   MCH 30.2 26.0 - 34.0 pg   MCHC 34.4 30.0 - 36.0 g/dL   RDW 62.912.4 52.811.5 - 41.315.5 %   Platelets 223 150 - 400 K/uL   nRBC 0.0 0.0 - 0.2 %    Comment: Performed at [facility], [address] [place], [place] [address]  SARS Coronavirus 2 [facility](Hospital order, Performed in [facility] hospital lab) Nasopharyngeal Nasopharyngeal Swab     Status: None   Collection Time: 05/07/19  9:33 PM   Specimen: Nasopharyngeal Swab  Result Value Ref Range   SARS Coronavirus 2 NEGATIVE NEGATIVE    Comment: (NOTE) If result is NEGATIVE SARS-CoV-2 target nucleic acids are NOT DETECTED. The SARS-CoV-2 RNA is generally detectable in upper and lower  respiratory specimens during the acute phase of infection. The lowest  concentration of SARS-CoV-2 viral copies this assay can detect is 250  copies / mL. A negative result does not preclude SARS-CoV-2 infection  and should not be used as the sole basis for treatment or other  patient management decisions.  A negative result may occur with  improper specimen collection / handling, submission of specimen other  than nasopharyngeal swab, presence of viral mutation(s) within the  areas targeted by this assay, and inadequate number of viral copies  (<250 copies / mL). A negative result must be combined with clinical  observations, patient history, and epidemiological information. If result is POSITIVE SARS-CoV-2 target nucleic acids are DETECTED. The SARS-CoV-2 RNA is generally detectable in upper and lower  respiratory specimens dur ing the acute phase of infection.  Positive  results are indicative of active infection with SARS-CoV-2.  Clinical  correlation with patient history and other diagnostic information is  necessary to determine patient infection status.  Positive results do  not rule out bacterial infection or co-infection  with other viruses. If result is PRESUMPTIVE POSTIVE SARS-CoV-2 nucleic acids MAY BE PRESENT.   A presumptive positive result was obtained on the submitted specimen  and confirmed on repeat testing.  While 2019 novel coronavirus  (SARS-CoV-2) nucleic acids may be present in the submitted sample  additional confirmatory testing may be necessary for epidemiological  and / or clinical management purposes  to differentiate between  SARS-CoV-2 and other Sarbecovirus currently known to infect humans.  If clinically indicated additional testing with an alternate test  methodology [phone]) is advised. The SARS-CoV-2 RNA is generally  detectable in upper and lower respiratory sp ecimens during the acute  phase of infection. The expected result is Negative. Fact Sheet for Patients:  BoilerBrush.com.cy Fact Sheet for Healthcare Providers: https://pope.com/ This test is not yet approved or cleared by the [place] FDA and has been authorized for detection and/or diagnosis of SARS-CoV-2 by FDA under an Emergency Use Authorization (EUA).  This EUA will remain in effect (meaning this test can be used) for the duration of the COVID-19 declaration under Section 564(b)(1) of the Act, 21 U.S.C. section 360bbb-3(b)(1), unless the authorization is terminated or revoked sooner. Performed at [facility], [address] [place], [place] [address]   Glucose, capillary     Status: Abnormal   Collection Time: 05/08/19  4:19 AM  Result Value Ref Range   Glucose-Capillary 173 (H) 70 - 99 mg/dL    Ct Abdomen Pelvis W Contrast  Result Date: 05/07/2019 CLINICAL DATA:  Abdominal pain, elevated LFTs, epigastric pain EXAM: CT ABDOMEN AND PELVIS WITH CONTRAST TECHNIQUE: Multidetector CT imaging of the abdomen and pelvis was performed using the standard protocol following bolus administration of intravenous contrast. CONTRAST:  OMNIPAQUE IOHEXOL 300 MG/ML   SOLN COMPARISON:  Abdominal ultrasound 10/15/2016 FINDINGS: Lower chest: Atelectatic changes are present in the lung bases. Normal heart size. No pericardial effusion. Hepatobiliary: No focal liver abnormality is seen. Small amount fluid within the gallbladder fossa. No frank gallbladder wall thickening. There is a punctate calcification near the level of the duodenum but this is unclear whether not this could reflect a distal CBD/ampullary stone. Pancreas: There are extensive retroperitoneal inflammatory changes however these do not appear focally centered upon the pancreas. The pancreas only appears only slightly edematous. No pancreatic ductal dilatation. Spleen: Normal in size without focal abnormality. Adrenals/Urinary Tract: Normal adrenal glands. Mild bilateral nonspecific perinephric stranding, a nonspecific finding though may correlate with either age or decreased renal function. Kidneys are otherwise unremarkable, without renal calculi, suspicious lesion, or hydronephrosis. Bladder is unremarkable. Stomach/Bowel: Distal esophagus is normal. The gastric antrum appears slightly thickened. There is mucosal hyperenhancement circumferential thickening of the second and third portions of the duodenal sweep with a questionable focus of mural hypoattenuation (2/45) which may reflect a perforated duodenal ulcer. Remainder of the small bowel is unremarkable. A normal appendix is visualized. No colonic dilatation or wall thickening. Vascular/Lymphatic: Atherosclerotic plaque within the normal caliber aorta. Reactive adenopathy in the upper abdomen. No suspicious or enlarged lymph nodes in the included lymphatic chains. Reproductive: The prostate and seminal vesicles are unremarkable. Other: No abdominopelvic free fluid or free gas. No bowel containing hernias. Musculoskeletal: Multilevel degenerative changes are present in the imaged portions of the spine. Findings are maximal at L5-S1 with slight retrolisthesis.  Features of Baastrup's disease. IMPRESSION: 1. Extensive retroperitoneal inflammatory changes appear largely centered upon the thickened and hyperenhancing second and third portions of the duodenal sweep with a questionable focus of mural hypoattenuation (2/45) which is highly suspicious for a perforated duodenal ulcer. 2. There are additional peripancreatic inflammatory features which could reflect a reactive inflammatory process from the adjacent duodenal inflammation or an intrinsic acute edematous pancreatitis. 3. Small amount of pericholecystic fluid without gallbladder wall thickening or visible intraluminal gallstones. A punctate calcification is seen in the duodenum near the ampulla, unclear if this could reflect an ampullary stone. Could consider  further evaluation with right upper quadrant ultrasound. 4. Mild bilateral nonspecific perinephric stranding, a nonspecific finding though may correlate with either age or decreased renal function. 5. Aortic Atherosclerosis (ICD10-I70.0). Electronically Signed   By: Kreg Shropshire M.D.   On: 05/07/2019 20:53    ROS - all of the below systems have been reviewed with the patient and positives are indicated with bold text General: chills, fever or night sweats Eyes: blurry vision or double vision ENT: epistaxis or sore throat Allergy/Immunology: itchy/watery eyes or nasal congestion Hematologic/Lymphatic: bleeding problems, blood clots or swollen lymph nodes Endocrine: temperature intolerance or unexpected weight changes Breast: new or changing breast lumps or nipple discharge Resp: cough, shortness of breath, or wheezing CV: chest pain or dyspnea on exertion GI: as per HPI GU: dysuria, trouble voiding, or hematuria MSK: joint pain or joint stiffness Neuro: TIA or stroke symptoms Derm: pruritus and skin lesion changes Psych: anxiety and depression  PE Blood pressure (!) 153/95, pulse 80, temperature 98 F (36.7 C), temperature source Oral, resp.  rate 20, height 5\' 11"  (1.803 m), weight 86.2 kg, SpO2 96 %. Constitutional: NAD; conversant; no deformities Eyes: Moist conjunctiva; no lid lag; anicteric; PERRL Neck: Trachea midline; no thyromegaly Lungs: Normal respiratory effort; no tactile fremitus CV: RRR; no palpable thrills; no pitting edema GI: Abd soft, moderately ttp in mid epigastrium; no tenderness elsewhere; mildly distended; no rebound; no guarding; no palpable hepatosplenomegaly MSK: No apparent abnormalities of his extremities, SCDs in place; no clubbing/cyanosis Psychiatric: Appropriate affect; alert and oriented x3 Lymphatic: No palpable cervical or axillary lymphadenopathy  Results for orders placed or performed during the hospital encounter of 05/07/19 (from the past 48 hour(s))  Urinalysis, Routine w reflex microscopic     Status: Abnormal   Collection Time: 05/07/19  3:39 PM  Result Value Ref Range   Color, Urine AMBER (A) YELLOW    Comment: BIOCHEMICALS MAY BE AFFECTED BY COLOR   APPearance CLEAR CLEAR   Specific Gravity, Urine >1.046 (H) 1.005 - 1.030   pH 5.0 5.0 - 8.0   Glucose, UA NEGATIVE NEGATIVE mg/dL   Hgb urine dipstick NEGATIVE NEGATIVE   Bilirubin Urine NEGATIVE NEGATIVE   Ketones, ur 5 (A) NEGATIVE mg/dL   Protein, ur 30 (A) NEGATIVE mg/dL   Nitrite NEGATIVE NEGATIVE   Leukocytes,Ua NEGATIVE NEGATIVE   RBC / HPF 0-5 0 - 5 RBC/hpf   WBC, UA 0-5 0 - 5 WBC/hpf   Bacteria, UA NONE SEEN NONE SEEN   Squamous Epithelial / LPF 0-5 0 - 5   Mucus PRESENT     Comment: Performed at [facility], [address] [place], [place] [address]  Lipase, blood     Status: Abnormal   Collection Time: 05/07/19  4:11 PM  Result Value Ref Range   Lipase 6,889 (H) 11 - 51 U/L    Comment: RESULTS CONFIRMED BY MANUAL DILUTION Performed at [facility], [address], [place], [place] [address]   Comprehensive metabolic panel     Status: Abnormal   Collection Time: 05/07/19  4:11 PM  Result Value Ref Range   Sodium  136 135 - 145 mmol/L   Potassium 3.7 3.5 - 5.1 mmol/L   Chloride 102 98 - 111 mmol/L   CO2 25 22 - 32 mmol/L   Glucose, Bld 158 (H) 70 - 99 mg/dL   BUN 15 8 - 23 mg/dL   Creatinine, Ser 0.98 0.61 - 1.24 mg/dL   Calcium 9.2 8.9 - 11.9 mg/dL  Total Protein 7.7 6.5 - 8.1 g/dL   Albumin 4.0 3.5 - 5.0 g/dL   AST 161 (H) 15 - 41 U/L   ALT 378 (H) 0 - 44 U/L   Alkaline Phosphatase 91 38 - 126 U/L   Total Bilirubin 3.1 (H) 0.3 - 1.2 mg/dL   GFR calc non Af Amer >60 >60 mL/min   GFR calc Af Amer >60 >60 mL/min   Anion gap 9 5 - 15    Comment: Performed at [facility], [address] [place], [place] [address]  CBC     Status: Abnormal   Collection Time: 05/07/19  4:11 PM  Result Value Ref Range   WBC 12.6 (H) 4.0 - 10.5 K/uL   RBC 5.49 4.22 - 5.81 MIL/uL   Hemoglobin 16.6 13.0 - 17.0 g/dL   HCT 54.0 98.1 - 19.1 %   MCV 88.0 80.0 - 100.0 fL   MCH 30.2 26.0 - 34.0 pg   MCHC 34.4 30.0 - 36.0 g/dL   RDW 47.8 29.5 - 62.1 %   Platelets 223 150 - 400 K/uL   nRBC 0.0 0.0 - 0.2 %    Comment: Performed at [facility] [address], [place], [place] [address]  SARS Coronavirus 2 [facility] order, Performed in [facility] hospital lab) Nasopharyngeal Nasopharyngeal Swab     Status: None   Collection Time: 05/07/19  9:33 PM   Specimen: Nasopharyngeal Swab  Result Value Ref Range   SARS Coronavirus 2 NEGATIVE NEGATIVE    Comment: (NOTE) If result is NEGATIVE SARS-CoV-2 target nucleic acids are NOT DETECTED. The SARS-CoV-2 RNA is generally detectable in upper and lower  respiratory specimens during the acute phase of infection. The lowest  concentration of SARS-CoV-2 viral copies this assay can detect is 250  copies / mL. A negative result does not preclude SARS-CoV-2 infection  and should not be used as the sole basis for treatment or other  patient management decisions.  A negative result may occur with  improper specimen collection / handling, submission of specimen other  than  nasopharyngeal swab, presence of viral mutation(s) within the  areas targeted by this assay, and inadequate number of viral copies  (<250 copies / mL). A negative result must be combined with clinical  observations, patient history, and epidemiological information. If result is POSITIVE SARS-CoV-2 target nucleic acids are DETECTED. The SARS-CoV-2 RNA is generally detectable in upper and lower  respiratory specimens dur ing the acute phase of infection.  Positive  results are indicative of active infection with SARS-CoV-2.  Clinical  correlation with patient history and other diagnostic information is  necessary to determine patient infection status.  Positive results do  not rule out bacterial infection or co-infection with other viruses. If result is PRESUMPTIVE POSTIVE SARS-CoV-2 nucleic acids MAY BE PRESENT.   A presumptive positive result was obtained on the submitted specimen  and confirmed on repeat testing.  While 2019 novel coronavirus  (SARS-CoV-2) nucleic acids may be present in the submitted sample  additional confirmatory testing may be necessary for epidemiological  and / or clinical management purposes  to differentiate between  SARS-CoV-2 and other Sarbecovirus currently known to infect humans.  If clinically indicated additional testing with an alternate test  methodology [phone]) is advised. The SARS-CoV-2 RNA is generally  detectable in upper and lower respiratory sp ecimens during the acute  phase of infection. The expected result is Negative. Fact Sheet for Patients:  BoilerBrush.com.cy Fact Sheet for Healthcare Providers: https://pope.com/ This  test is not yet approved or cleared by the [place] and has been authorized for detection and/or diagnosis of SARS-CoV-2 by FDA under an Emergency Use Authorization (EUA).  This EUA will remain in effect (meaning this test can be used) for the duration of  the COVID-19 declaration under Section 564(b)(1) of the Act, 21 U.S.C. section 360bbb-3(b)(1), unless the authorization is terminated or revoked sooner. Performed at [facility], [address], [place], [place] [address]   Glucose, capillary     Status: Abnormal   Collection Time: 05/08/19  4:19 AM  Result Value Ref Range   Glucose-Capillary 173 (H) 70 - 99 mg/dL    Ct Abdomen Pelvis W Contrast  Result Date: 05/07/2019 CLINICAL DATA:  Abdominal pain, elevated LFTs, epigastric pain EXAM: CT ABDOMEN AND PELVIS WITH CONTRAST TECHNIQUE: Multidetector CT imaging of the abdomen and pelvis was performed using the standard protocol following bolus administration of intravenous contrast. CONTRAST:  OMNIPAQUE IOHEXOL 300 MG/ML  SOLN COMPARISON:  Abdominal ultrasound 10/15/2016 FINDINGS: Lower chest: Atelectatic changes are present in the lung bases. Normal heart size. No pericardial effusion. Hepatobiliary: No focal liver abnormality is seen. Small amount fluid within the gallbladder fossa. No frank gallbladder wall thickening. There is a punctate calcification near the level of the duodenum but this is unclear whether not this could reflect a distal CBD/ampullary stone. Pancreas: There are extensive retroperitoneal inflammatory changes however these do not appear focally centered upon the pancreas. The pancreas only appears only slightly edematous. No pancreatic ductal dilatation. Spleen: Normal in size without focal abnormality. Adrenals/Urinary Tract: Normal adrenal glands. Mild bilateral nonspecific perinephric stranding, a nonspecific finding though may correlate with either age or decreased renal function. Kidneys are otherwise unremarkable, without renal calculi, suspicious lesion, or hydronephrosis. Bladder is unremarkable. Stomach/Bowel: Distal esophagus is normal. The gastric antrum appears slightly thickened. There is mucosal hyperenhancement circumferential thickening of the second and third  portions of the duodenal sweep with a questionable focus of mural hypoattenuation (2/45) which may reflect a perforated duodenal ulcer. Remainder of the small bowel is unremarkable. A normal appendix is visualized. No colonic dilatation or wall thickening. Vascular/Lymphatic: Atherosclerotic plaque within the normal caliber aorta. Reactive adenopathy in the upper abdomen. No suspicious or enlarged lymph nodes in the included lymphatic chains. Reproductive: The prostate and seminal vesicles are unremarkable. Other: No abdominopelvic free fluid or free gas. No bowel containing hernias. Musculoskeletal: Multilevel degenerative changes are present in the imaged portions of the spine. Findings are maximal at L5-S1 with slight retrolisthesis. Features of Baastrup's disease. IMPRESSION: 1. Extensive retroperitoneal inflammatory changes appear largely centered upon the thickened and hyperenhancing second and third portions of the duodenal sweep with a questionable focus of mural hypoattenuation (2/45) which is highly suspicious for a perforated duodenal ulcer. 2. There are additional peripancreatic inflammatory features which could reflect a reactive inflammatory process from the adjacent duodenal inflammation or an intrinsic acute edematous pancreatitis. 3. Small amount of pericholecystic fluid without gallbladder wall thickening or visible intraluminal gallstones. A punctate calcification is seen in the duodenum near the ampulla, unclear if this could reflect an ampullary stone. Could consider further evaluation with right upper quadrant ultrasound. 4. Mild bilateral nonspecific perinephric stranding, a nonspecific finding though may correlate with either age or decreased renal function. 5. Aortic Atherosclerosis (ICD10-I70.0). Electronically Signed   By: Kreg Shropshire M.D.   On: 05/07/2019 20:53   A/P: Mark Kidd is an 63 y.o. male hx HTN, HLD, preDM, with likely acute biliary pancreatitis,  possible duodenal ulcer as  well  -NPO, MIVF -BID PPI; carafate -Trend labs including LFTs and lipase daily -RUQ US -GI consultation currently pending -He has a history of 4 mm gallbladder "polyps" found on ultrasound 2 years ago.  These often represent cholesterol debris and would be a source for biliary pancreatitis -We spent time reviewing his workup, imaging and diagnosis. We discussed tentative plan as outlined above. He expressed understanding and agreement with plan moving forward  Stephanie Couphristopher M. Cliffton AstersWhite, M.D. [facility] [phone]

## 2019-05-08 NOTE — Consult Note (Signed)
[facility] CONSULT Reason for consult: Pancreatitis Referring Physician: [facility].  PCP: Dr. Su Hilt.  Primary GI: Dr. Terie Kidd is an 63 y.o. male.  HPI: He was admitted with severe abdominal pain.  The symptoms began about 2 weeks ago when he had fairly severe pain one night that did resolve during the night.  He was started on some type of acid reducing medication by Dr. Su Hilt the name of which he cannot remember.  The feeling was that he might of had reflux.  He was taking this once a day and it may well have been omeprazole.  For the next 10 days he was asymptomatic on 823 he went to bed with no symptoms awoke during the night with severe pain like before but was even worse.  The pain continued, he came to the emergency room.Marland Kitchen  His labs revealed elevated WBC 12.6 lipase 7000, AST and ALT 4-500.  His total bilirubin was 3.1 and subsequently had risen to 7.0.  His lipase is falling to 1500.  CT scan was obtained and showed inflammatory changes around the pancreas.  There was marked thickening of the second and third duodenum with an area that look like it could have been a perforated duodenal ulcer.  No dilated ducts Were noted.  There was a calcification in the duodenum which was not clear but could have been a stone.  Ultrasound today showed a normal gallbladder without stones and no signs of biliary dilatation.  The CBD was measured at 0.5 cm with no evidence of CBD stone.  Past Medical History:  Diagnosis Date  . Diabetes mellitus without complication (HCC)    pre diabetic   . High cholesterol   . Hypertension     History reviewed. No pertinent surgical history.  No family history on file.  Social History:  reports that he has never smoked. He has never used smokeless tobacco. He reports current alcohol use. He reports that he does not use drugs.  He drinks beer occasionally  Allergies: No Known Allergies  Medications; Prior to Admission medications    Medication Sig Start Date End Date Taking? Authorizing Provider  carvedilol (COREG) 6.25 MG tablet Take 6.25 mg by mouth every evening.   Yes [provider]  metFORMIN (GLUCOPHAGE) 500 MG tablet Take 500 mg by mouth every evening.    Yes [provider]  simvastatin (ZOCOR) 20 MG tablet Take 20 mg by mouth every evening.    Yes [provider]   . enoxaparin (LOVENOX) injection  40 mg Subcutaneous Q24H  . insulin aspart  0-9 Units Subcutaneous Q4H  . pantoprazole (PROTONIX) IV  40 mg Intravenous Q12H  . sucralfate  1 g Oral TID WC & HS   PRN Meds acetaminophen **OR** acetaminophen, fentaNYL (SUBLIMAZE) injection, HYDROmorphone (DILAUDID) injection, ondansetron **OR** ondansetron (ZOFRAN) IV Results for orders placed or performed during the hospital encounter of 05/07/19 (from the past 48 hour(s))  Urinalysis, Routine w reflex microscopic     Status: Abnormal   Collection Time: 05/07/19  3:39 PM  Result Value Ref Range   Color, Urine AMBER (A) YELLOW    Comment: BIOCHEMICALS MAY BE AFFECTED BY COLOR   APPearance CLEAR CLEAR   Specific Gravity, Urine >1.046 (H) 1.005 - 1.030   pH 5.0 5.0 - 8.0   Glucose, UA NEGATIVE NEGATIVE mg/dL   Hgb urine dipstick NEGATIVE NEGATIVE   Bilirubin Urine NEGATIVE NEGATIVE   Ketones, ur 5 (A) NEGATIVE mg/dL   Protein, ur  30 (A) NEGATIVE mg/dL   Nitrite NEGATIVE NEGATIVE   Leukocytes,Ua NEGATIVE NEGATIVE   RBC / HPF 0-5 0 - 5 RBC/hpf   WBC, UA 0-5 0 - 5 WBC/hpf   Bacteria, UA NONE SEEN NONE SEEN   Squamous Epithelial / LPF 0-5 0 - 5   Mucus PRESENT     Comment: Performed at [facility], [address], [place], [place] [address]  Lipase, blood     Status: Abnormal   Collection Time: 05/07/19  4:11 PM  Result Value Ref Range   Lipase 6,889 (H) 11 - 51 U/L    Comment: RESULTS CONFIRMED BY MANUAL DILUTION Performed at [facility], [address] [place], [place] [address]   Comprehensive metabolic panel     Status:  Abnormal   Collection Time: 05/07/19  4:11 PM  Result Value Ref Range   Sodium 136 135 - 145 mmol/L   Potassium 3.7 3.5 - 5.1 mmol/L   Chloride 102 98 - 111 mmol/L   CO2 25 22 - 32 mmol/L   Glucose, Bld 158 (H) 70 - 99 mg/dL   BUN 15 8 - 23 mg/dL   Creatinine, Ser 0.98 0.61 - 1.24 mg/dL   Calcium 9.2 8.9 - 11.9 mg/dL   Total Protein 7.7 6.5 - 8.1 g/dL   Albumin 4.0 3.5 - 5.0 g/dL   AST 147 (H) 15 - 41 U/L   ALT 378 (H) 0 - 44 U/L   Alkaline Phosphatase 91 38 - 126 U/L   Total Bilirubin 3.1 (H) 0.3 - 1.2 mg/dL   GFR calc non Af Amer >60 >60 mL/min   GFR calc Af Amer >60 >60 mL/min   Anion gap 9 5 - 15    Comment: Performed at [facility], [address] [place], [place] [address]  CBC     Status: Abnormal   Collection Time: 05/07/19  4:11 PM  Result Value Ref Range   WBC 12.6 (H) 4.0 - 10.5 K/uL   RBC 5.49 4.22 - 5.81 MIL/uL   Hemoglobin 16.6 13.0 - 17.0 g/dL   HCT 21.3 08.6 - 57.8 %   MCV 88.0 80.0 - 100.0 fL   MCH 30.2 26.0 - 34.0 pg   MCHC 34.4 30.0 - 36.0 g/dL   RDW 46.9 62.9 - 52.8 %   Platelets 223 150 - 400 K/uL   nRBC 0.0 0.0 - 0.2 %    Comment: Performed at [facility], [address] [place], [place] [address]  SARS Coronavirus 2 [facility] order, Performed in [facility] hospital lab) Nasopharyngeal Nasopharyngeal Swab     Status: None   Collection Time: 05/07/19  9:33 PM   Specimen: Nasopharyngeal Swab  Result Value Ref Range   SARS Coronavirus 2 NEGATIVE NEGATIVE    Comment: (NOTE) If result is NEGATIVE SARS-CoV-2 target nucleic acids are NOT DETECTED. The SARS-CoV-2 RNA is generally detectable in upper and lower  respiratory specimens during the acute phase of infection. The lowest  concentration of SARS-CoV-2 viral copies this assay can detect is 250  copies / mL. A negative result does not preclude SARS-CoV-2 infection  and should not be used as the sole basis for treatment or other  patient management decisions.  A negative result may occur with   improper specimen collection / handling, submission of specimen other  than nasopharyngeal swab, presence of viral mutation(s) within the  areas targeted by this assay, and inadequate number of viral copies  (<250 copies / mL). A negative result  must be combined with clinical  observations, patient history, and epidemiological information. If result is POSITIVE SARS-CoV-2 target nucleic acids are DETECTED. The SARS-CoV-2 RNA is generally detectable in upper and lower  respiratory specimens dur ing the acute phase of infection.  Positive  results are indicative of active infection with SARS-CoV-2.  Clinical  correlation with patient history and other diagnostic information is  necessary to determine patient infection status.  Positive results do  not rule out bacterial infection or co-infection with other viruses. If result is PRESUMPTIVE POSTIVE SARS-CoV-2 nucleic acids MAY BE PRESENT.   A presumptive positive result was obtained on the submitted specimen  and confirmed on repeat testing.  While 2019 novel coronavirus  (SARS-CoV-2) nucleic acids may be present in the submitted sample  additional confirmatory testing may be necessary for epidemiological  and / or clinical management purposes  to differentiate between  SARS-CoV-2 and other Sarbecovirus currently known to infect humans.  If clinically indicated additional testing with an alternate test  methodology [id number]) is advised. The SARS-CoV-2 RNA is generally  detectable in upper and lower respiratory sp ecimens during the acute  phase of infection. The expected result is Negative. Fact Sheet for Patients:  StrictlyIdeas.no Fact Sheet for Healthcare Providers: BankingDealers.co.za This test is not yet approved or cleared by the [place] FDA and has been authorized for detection and/or diagnosis of SARS-CoV-2 by FDA under an Emergency Use Authorization (EUA).  This EUA will  remain in effect (meaning this test can be used) for the duration of the COVID-19 declaration under Section 564(b)(1) of the Act, 21 U.S.C. section 360bbb-3(b)(1), unless the authorization is terminated or revoked sooner. Performed at [facility], [address], [place], [place] [address]   Glucose, capillary     Status: Abnormal   Collection Time: 05/08/19  4:19 AM  Result Value Ref Range   Glucose-Capillary 173 (H) 70 - 99 mg/dL  Hemoglobin A1c     Status: Abnormal   Collection Time: 05/08/19  5:45 AM  Result Value Ref Range   Hgb A1c MFr Bld 6.4 (H) 4.8 - 5.6 %    Comment: (NOTE) Pre diabetes:          5.7%-6.4% Diabetes:              >6.4% Glycemic control for   <7.0% adults with diabetes    Mean Plasma Glucose 136.98 mg/dL    Comment: Performed at [facility] [address], [place], [place] [address]  CBC     Status: Abnormal   Collection Time: 05/08/19  5:45 AM  Result Value Ref Range   WBC 15.5 (H) 4.0 - 10.5 K/uL   RBC 5.88 (H) 4.22 - 5.81 MIL/uL   Hemoglobin 18.0 (H) 13.0 - 17.0 g/dL   HCT 51.6 39.0 - 52.0 %   MCV 87.8 80.0 - 100.0 fL   MCH 30.6 26.0 - 34.0 pg   MCHC 34.9 30.0 - 36.0 g/dL   RDW 12.5 11.5 - 15.5 %   Platelets 204 150 - 400 K/uL   nRBC 0.0 0.0 - 0.2 %    Comment: Performed at [facility], [place] [address] [place],  [address]  Comprehensive metabolic panel     Status: Abnormal   Collection Time: 05/08/19  5:45 AM  Result Value Ref Range   Sodium 139 135 - 145 mmol/L   Potassium 3.9 3.5 - 5.1 mmol/L   Chloride 104 98 - 111 mmol/L   CO2 22  22 - 32 mmol/L   Glucose, Bld 156 (H) 70 - 99 mg/dL   BUN 17 8 - 23 mg/dL   Creatinine, Ser 1.610.97 0.61 - 1.24 mg/dL   Calcium 8.9 8.9 - 09.610.3 mg/dL   Total Protein 7.2 6.5 - 8.1 g/dL   Albumin 3.7 3.5 - 5.0 g/dL   AST 045502 (H) 15 - 41 U/L   ALT 497 (H) 0 - 44 U/L   Alkaline Phosphatase 162 (H) 38 - 126 U/L   Total Bilirubin 7.0 (H) 0.3 - 1.2 mg/dL   GFR calc non Af Amer >60 >60 mL/min    GFR calc Af Amer >60 >60 mL/min   Anion gap 13 5 - 15    Comment: Performed at [facility], [address] [place], [place] [address]  Lipase, blood     Status: Abnormal   Collection Time: 05/08/19  5:45 AM  Result Value Ref Range   Lipase 1,526 (H) 11 - 51 U/L    Comment: RESULTS CONFIRMED BY MANUAL DILUTION Performed at [facility], [address] [place], [place] [address]   Glucose, capillary     Status: Abnormal   Collection Time: 05/08/19  8:11 AM  Result Value Ref Range   Glucose-Capillary 156 (H) 70 - 99 mg/dL    Ct Abdomen Pelvis W Contrast  Result Date: 05/07/2019 CLINICAL DATA:  Abdominal pain, elevated LFTs, epigastric pain EXAM: CT ABDOMEN AND PELVIS WITH CONTRAST TECHNIQUE: Multidetector CT imaging of the abdomen and pelvis was performed using the standard protocol following bolus administration of intravenous contrast. CONTRAST:  100mL OMNIPAQUE IOHEXOL 300 MG/ML  SOLN COMPARISON:  Abdominal ultrasound 10/15/2016 FINDINGS: Lower chest: Atelectatic changes are present in the lung bases. Normal heart size. No pericardial effusion. Hepatobiliary: No focal liver abnormality is seen. Small amount fluid within the gallbladder fossa. No frank gallbladder wall thickening. There is a punctate calcification near the level of the duodenum but this is unclear whether not this could reflect a distal CBD/ampullary stone. Pancreas: There are extensive retroperitoneal inflammatory changes however these do not appear focally centered upon the pancreas. The pancreas only appears only slightly edematous. No pancreatic ductal dilatation. Spleen: Normal in size without focal abnormality. Adrenals/Urinary Tract: Normal adrenal glands. Mild bilateral nonspecific perinephric stranding, a nonspecific finding though may correlate with either age or decreased renal function. Kidneys are otherwise unremarkable, without renal calculi, suspicious lesion, or hydronephrosis. Bladder is unremarkable.  Stomach/Bowel: Distal esophagus is normal. The gastric antrum appears slightly thickened. There is mucosal hyperenhancement circumferential thickening of the second and third portions of the duodenal sweep with a questionable focus of mural hypoattenuation (2/45) which may reflect a perforated duodenal ulcer. Remainder of the small bowel is unremarkable. A normal appendix is visualized. No colonic dilatation or wall thickening. Vascular/Lymphatic: Atherosclerotic plaque within the normal caliber aorta. Reactive adenopathy in the upper abdomen. No suspicious or enlarged lymph nodes in the included lymphatic chains. Reproductive: The prostate and seminal vesicles are unremarkable. Other: No abdominopelvic free fluid or free gas. No bowel containing hernias. Musculoskeletal: Multilevel degenerative changes are present in the imaged portions of the spine. Findings are maximal at L5-S1 with slight retrolisthesis. Features of Baastrup's disease. IMPRESSION: 1. Extensive retroperitoneal inflammatory changes appear largely centered upon the thickened and hyperenhancing second and third portions of the duodenal sweep with a questionable focus of mural hypoattenuation (2/45) which is highly suspicious for a perforated duodenal ulcer. 2. There are additional peripancreatic inflammatory features which could reflect a  reactive inflammatory process from the adjacent duodenal inflammation or an intrinsic acute edematous pancreatitis. 3. Small amount of pericholecystic fluid without gallbladder wall thickening or visible intraluminal gallstones. A punctate calcification is seen in the duodenum near the ampulla, unclear if this could reflect an ampullary stone. Could consider further evaluation with right upper quadrant ultrasound. 4. Mild bilateral nonspecific perinephric stranding, a nonspecific finding though may correlate with either age or decreased renal function. 5. Aortic Atherosclerosis (ICD10-I70.0). Electronically Signed    By: Kreg ShropshirePrice  DeHay M.D.   On: 05/07/2019 20:53   [place] Abdomen Limited Ruq  Result Date: 05/08/2019 CLINICAL DATA:  Right upper quadrant pain. EXAM: ULTRASOUND ABDOMEN LIMITED RIGHT UPPER QUADRANT COMPARISON:  10/15/2016 and abdominal CT 05/07/2019 FINDINGS: Gallbladder: No gallstones or wall thickening visualized. No sonographic Murphy sign noted by sonographer. Common bile duct: Diameter: 0.5 cm Liver: Increased echogenicity with mild heterogeneity. No focal lesion. No intrahepatic biliary dilatation. Portal vein is patent on color Doppler imaging with normal direction of blood flow towards the liver. Other: Trace fluid around the liver. IMPRESSION: 1. Normal appearance of the gallbladder without stones. No biliary dilatation. 2. Liver is echogenic and slightly heterogeneous. Trace perihepatic fluid is similar to the recent CT findings. Electronically Signed   By: Richarda OverlieAdam  Henn M.D.   On: 05/08/2019 07:49   ROS: Constitutional: Negative HEENT: Negative Cardiovascular: History of hypertension no other cardiac problems Respiratory: Negative GI: As above GU: History of renal stones in the past Musculoskeletal: Negative Neuro/Psychiatric: Negative Endocrine/Heme: Patient has a history of prediabetes and high cholesterol.            Blood pressure (!) 153/95, pulse 80, temperature 98 F (36.7 C), temperature source Oral, resp. rate 20, height 5\' 11"  (1.803 m), weight 86.2 kg, SpO2 96 %.  Physical exam:   General--alert Asian male in no distress ENT--nonicteric Neck--supple Heart--regular rate and rhythm without murmurs or gallops Lungs--clear Abdomen--obese somewhat distended with diminished bowel sounds mild epigastric tenderness Psych--alert and oriented answers questions appropriately   Assessment: 1.  Pancreatitis.  I suspect this is gallstone pancreatitis given the degree of elevation of the lipase and liver tests with elevated bilirubin.  Unusual high his CBD is not dilated.   Fortunately his labs seem to be improving.  He has minimal alcohol history.  I doubt that this is a duodenal ulcer since he has been taking omeprazole for 10 days prior to this acute event.  Plan: We will continue to follow.  Hopefully he will continue to improve and we can obtain an MRCP.  Due to the marked thickening of the duodenum on CT scan ERCP would likely be quite difficult until the swelling and edema improves.  Surgery is on board following   Tresea MallJames L Adaya Garmany 05/08/2019, 10:41 AM   This note was created using voice recognition software and minor errors may Have occurred unintentionally. Pager: [phone] If no answer or after hours call [phone]

## 2019-05-09 LAB — GLUCOSE, CAPILLARY
Glucose-Capillary: 125 mg/dL — ABNORMAL HIGH (ref 70–99)
Glucose-Capillary: 126 mg/dL — ABNORMAL HIGH (ref 70–99)
Glucose-Capillary: 132 mg/dL — ABNORMAL HIGH (ref 70–99)
Glucose-Capillary: 136 mg/dL — ABNORMAL HIGH (ref 70–99)
Glucose-Capillary: 137 mg/dL — ABNORMAL HIGH (ref 70–99)
Glucose-Capillary: 138 mg/dL — ABNORMAL HIGH (ref 70–99)
Glucose-Capillary: 148 mg/dL — ABNORMAL HIGH (ref 70–99)

## 2019-05-09 LAB — CBC WITH DIFFERENTIAL/PLATELET
Abs Immature Granulocytes: 0.09 10*3/uL — ABNORMAL HIGH (ref 0.00–0.07)
Basophils Absolute: 0 10*3/uL (ref 0.0–0.1)
Basophils Relative: 0 %
Eosinophils Absolute: 0 10*3/uL (ref 0.0–0.5)
Eosinophils Relative: 0 %
HCT: 51.5 % (ref 39.0–52.0)
Hemoglobin: 17.4 g/dL — ABNORMAL HIGH (ref 13.0–17.0)
Immature Granulocytes: 0 %
Lymphocytes Relative: 3 %
Lymphs Abs: 0.7 10*3/uL (ref 0.7–4.0)
MCH: 30.5 pg (ref 26.0–34.0)
MCHC: 33.8 g/dL (ref 30.0–36.0)
MCV: 90.4 fL (ref 80.0–100.0)
Monocytes Absolute: 1.3 10*3/uL — ABNORMAL HIGH (ref 0.1–1.0)
Monocytes Relative: 6 %
Neutro Abs: 19.3 10*3/uL — ABNORMAL HIGH (ref 1.7–7.7)
Neutrophils Relative %: 91 %
Platelets: 182 10*3/uL (ref 150–400)
RBC: 5.7 MIL/uL (ref 4.22–5.81)
RDW: 13.5 % (ref 11.5–15.5)
WBC: 21.4 10*3/uL — ABNORMAL HIGH (ref 4.0–10.5)
nRBC: 0 % (ref 0.0–0.2)

## 2019-05-09 LAB — COMPREHENSIVE METABOLIC PANEL
ALT: 326 U/L — ABNORMAL HIGH (ref 0–44)
AST: 213 U/L — ABNORMAL HIGH (ref 15–41)
Albumin: 3 g/dL — ABNORMAL LOW (ref 3.5–5.0)
Alkaline Phosphatase: 185 U/L — ABNORMAL HIGH (ref 38–126)
Anion gap: 13 (ref 5–15)
BUN: 17 mg/dL (ref 8–23)
CO2: 19 mmol/L — ABNORMAL LOW (ref 22–32)
Calcium: 7.5 mg/dL — ABNORMAL LOW (ref 8.9–10.3)
Chloride: 108 mmol/L (ref 98–111)
Creatinine, Ser: 1.08 mg/dL (ref 0.61–1.24)
GFR calc Af Amer: >60 mL/min (ref >60)
GFR calc non Af Amer: >60 mL/min (ref >60)
Glucose, Bld: 142 mg/dL — ABNORMAL HIGH (ref 70–99)
Potassium: 3.6 mmol/L (ref 3.5–5.1)
Sodium: 140 mmol/L (ref 135–145)
Total Bilirubin: 9.3 mg/dL — ABNORMAL HIGH (ref 0.3–1.2)
Total Protein: 6.3 g/dL — ABNORMAL LOW (ref 6.5–8.1)

## 2019-05-09 LAB — HEPATIC FUNCTION PANEL
ALT: 320 U/L — ABNORMAL HIGH (ref 0–44)
AST: 214 U/L — ABNORMAL HIGH (ref 15–41)
Albumin: 3 g/dL — ABNORMAL LOW (ref 3.5–5.0)
Alkaline Phosphatase: 191 U/L — ABNORMAL HIGH (ref 38–126)
Bilirubin, Direct: 5.4 mg/dL — ABNORMAL HIGH (ref 0.0–0.2)
Indirect Bilirubin: 3.3 mg/dL — ABNORMAL HIGH (ref 0.3–0.9)
Total Bilirubin: 8.7 mg/dL — ABNORMAL HIGH (ref 0.3–1.2)
Total Protein: 6.6 g/dL (ref 6.5–8.1)

## 2019-05-09 LAB — LIPASE, BLOOD: Lipase: 824 U/L — ABNORMAL HIGH (ref 11–51)

## 2019-05-09 MED ORDER — SODIUM CHLORIDE 0.9 % IV SOLN
2.0000 g | INTRAVENOUS | Status: DC
  Administered 2019-05-09 – 2019-05-14 (×6): 2 g via INTRAVENOUS
  Filled 2019-05-09 (×6): qty 2
  Filled 2019-05-09: qty 20

## 2019-05-09 MED ORDER — DIPHENHYDRAMINE HCL 25 MG PO CAPS: 25.0000 mg | ORAL_CAPSULE | Freq: Once | ORAL | Status: DC

## 2019-05-09 MED ORDER — DIPHENHYDRAMINE HCL 50 MG/ML IJ SOLN
12.5000 mg | Freq: Once | INTRAMUSCULAR | Status: AC
  Administered 2019-05-09: 12.5 mg via INTRAVENOUS
  Filled 2019-05-09: qty 1

## 2019-05-09 NOTE — Progress Notes (Signed)
PROGRESS NOTE  Mark Kidd VOJ:[id number] DOB: August 09, 1956 DOA: 05/07/2019 PCP: Lorene Dy, MD  Brief History   Mark Kidd  is a 63 y.o. male, with history of diabetes mellitus type 2, hyperlipidemia, hypertension who came to hospital with epigastric pain which started last night.  Patient says that pain was very severe and kind of eased off around 1 AM.  In the morning though he felt better.  As soon as he ate food the pain returned after 1 hour.  He was also sweating and in extreme discomfort due to pain.  Patient says that he had a similar pain around August 10 at that time he was seen by his PCP and was prescribed omeprazole which did not help the pain.  He had one episode of vomiting in the hospital. He denies vomiting any blood. Denies black stool. Denies blood in the stool. Denies previous history of gallstones. Denies chest pain or shortness of breath. Denies fever or chills.  In the ED CT scan of the abdomen pelvis showed marked retroperitoneal inflammatory changes around second and third part of duodenum concerning for perforated peptic ulcer, also surrounding inflammation of pancreas.  Lipase significantly elevated to 6000, calcification noted at ampulla of greater concern for stone.  Both GI, Nandigam  and general surgery Dr Dema Severin were consulted at [facility] by Dr. Lacinda Axon.  They both will see patient as consult when patient reaches [facility].  No urgent surgery recommended as per conversation of Dr. Lacinda Axon and Dr. Dema Severin.  The patient has been admitted to a medical bed. He has continued to have abdominal pain. He has been seen by Dr. Dema Severin and Dr. Oletta Lamas.  On 05/09/2019 the patient had improvement in lipase, but increase in bilirubin and WBC. MRCP is pending.  Consultants  . General Surgery . Gastroenterology  Procedures  . None  Antibiotics   Anti-infectives (From admission, onward)   Start     Dose/Rate Route Frequency Ordered Stop   05/09/19 0930  cefTRIAXone (ROCEPHIN) 2 g in  sodium chloride 0.9 % 100 mL IVPB     2 g 200 mL/hr over 30 Minutes Intravenous Every 24 hours 05/09/19 0926       Subjective  The patient is resting in bed. He continues to complain of abdominal pain. No new complaints.  Objective   Vitals:  Vitals:   05/09/19 1147 05/09/19 1556  BP: 128/80 125/74  Pulse: (!) 101 (!) 104  Resp:    Temp: 99.3 F (37.4 C) 98 F (36.7 C)  SpO2: 100% 94%    Exam:  Constitutional:  . The patient is resting comfortably. Mild distress from abdominal pain. Respiratory:  . No increased work of breathing. . No murmurs, ectopy or gallups. . No lateral PMI. No thrills. Cardiovascular:  . Regular rate and rhythm . No murmurs, ectopy, or gallups . No lateral PMI. No thrills. Abdomen:  . Abdomen appears normal; no tenderness or masses . No hernias, masses, or organomegaly. . Normoactive bowel sounds.  Musculoskeletal:  . No cyanosis, clubbing, or edema Skin:  . No rashes, lesions, ulcers . palpation of skin: no induration or nodules Neurologic:  . CN 2-12 intact . Sensation all 4 extremities intact Psychiatric:  . Mental status o Mood, affect appropriate o Orientation to person, place, time  . judgment and insight appear intact    I have personally reviewed the following:   Today's Data  . Vitals, CBC, CMP  Scheduled Meds: . enoxaparin (LOVENOX) injection  40 mg Subcutaneous  Q24H  . insulin aspart  0-9 Units Subcutaneous Q4H  . pantoprazole (PROTONIX) IV  40 mg Intravenous Q12H  . sucralfate  1 g Oral TID WC & HS   Continuous Infusions: . sodium chloride 150 mL/hr at 05/09/19 1551  . cefTRIAXone (ROCEPHIN)  IV 2 g (05/09/19 1028)    No problems updated.   LOS: 2 days   A & P  Perforated duodenal ulcer-as per CT scan abdomen pelvis as above, patient is clinically stable.  Vitals are stable, does not have rigidity or guarding on abdominal examination.  General surgery has been consulted by ED physician, Dr. Dema Severin will see  patient when patient [facility] at [facility].  We will keep him n.p.o., dilaudid, IV normal saline at 125 mill per hour. General surgery wishes to treat this problem conservatively with close monitoring.  Acute pancreatitis-lipase significantly elevated 6889, AST 514, ALT 378, alk phos 91, total bilirubin 3.1. CT abdomen also shows punctate calcification in the duodenum near the ampulla which could reflect an ampullary stone.  GI has been consulted.  GI has seen the patient.  Patient will be kept n.p.o. as above.  Pain control and IV fluids as above. Lipase is down to 800's on 8/226/2020.   Hyperbilirubinemia: Bilirubin remains elevated. WBC increased sharply to 21.4. MRCP is pending.  Diabetes mellitus type 2-we will initiate sliding scale insulin with NovoLog. Check CBG every 4 hours.  Metformin on hold.  Hypertension-blood pressure stable, patient takes Coreg 6.25 mg every evening.  Will start metoprolol 2.5 mg IV every 8 hours   I have seen and examined this patient myself. I have spent 32 minutes in his evaluation and care.  DVT Prophylaxis:  Lovenox  Family Communication: Admission, patients condition and plan of care including tests being ordered have been discussed with the patient who indicate understanding and agree with the plan and Code Status. Code Status: Full code Disposition: Home  [name], DO [facility] Direct contact: see [email or web address]  7PM-7AM contact night coverage as above 05/08/2019, 2:18 PM  LOS: 1 day

## 2019-05-09 NOTE — Progress Notes (Signed)
Patient ID: Mark Kidd, male   DOB: Apr 14, 1956, 63 y.o.   MRN: [id number]       Subjective: Patient still with mostly left-sided abdominal pain, but comfortable.  No nausea or vomiting.  + flatus, no BM.  Feels a little bloated.  Objective: Vital signs in last 24 hours: Temp:  [98.3 F (36.8 C)-99.2 F (37.3 C)] 98.6 F (37 C) (08/26 0835) Pulse Rate:  [92-100] 98 (08/26 0835) Resp:  [16-20] 16 (08/26 0445) BP: (123-144)/(80-91) 127/80 (08/26 0835) SpO2:  [93 %-94 %] 93 % (08/26 0835) Last BM Date: 05/07/19  Intake/Output from previous day: 08/25 0701 - 08/26 0700 In: 2823 [I.V.:2823] Out: 725 [Urine:725] Intake/Output this shift: No intake/output data recorded.  PE: Heart: regular Lungs: CTAB Abd: soft, but somewhat bloated, tender across upper abdomen, but really greatest in epigastrium and LUQ, minimal to no pain in RUQ, +BS  Lab Results:  Recent Labs    05/08/19 0545 05/09/19 0224  WBC 15.5* 21.4*  HGB 18.0* 17.4*  HCT 51.6 51.5  PLT 204 182   BMET Recent Labs    05/08/19 0545 05/09/19 0224  NA 139 140  K 3.9 3.6  CL 104 108  CO2 22 19*  GLUCOSE 156* 142*  BUN 17 17  CREATININE 0.97 1.08  CALCIUM 8.9 7.5*   PT/INR No results for input(s): LABPROT, INR in the last 72 hours. CMP     Component Value Date/Time   NA 140 05/09/2019 0224   K 3.6 05/09/2019 0224   CL 108 05/09/2019 0224   CO2 19 (L) 05/09/2019 0224   GLUCOSE 142 (H) 05/09/2019 0224   BUN 17 05/09/2019 0224   CREATININE 1.08 05/09/2019 0224   CALCIUM 7.5 (L) 05/09/2019 0224   PROT 6.3 (L) 05/09/2019 0224   ALBUMIN 3.0 (L) 05/09/2019 0224   AST 213 (H) 05/09/2019 0224   ALT 326 (H) 05/09/2019 0224   ALKPHOS 185 (H) 05/09/2019 0224   BILITOT 9.3 (H) 05/09/2019 0224   GFRNONAA >60 05/09/2019 0224   GFRAA >60 05/09/2019 0224   Lipase     Component Value Date/Time   LIPASE 824 (H) 05/09/2019 0224       Studies/Results: Ct Abdomen Pelvis W Contrast  Result Date:  05/07/2019 CLINICAL DATA:  Abdominal pain, elevated LFTs, epigastric pain EXAM: CT ABDOMEN AND PELVIS WITH CONTRAST TECHNIQUE: Multidetector CT imaging of the abdomen and pelvis was performed using the standard protocol following bolus administration of intravenous contrast. CONTRAST:  100mL OMNIPAQUE IOHEXOL 300 MG/ML  SOLN COMPARISON:  Abdominal ultrasound 10/15/2016 FINDINGS: Lower chest: Atelectatic changes are present in the lung bases. Normal heart size. No pericardial effusion. Hepatobiliary: No focal liver abnormality is seen. Small amount fluid within the gallbladder fossa. No frank gallbladder wall thickening. There is a punctate calcification near the level of the duodenum but this is unclear whether not this could reflect a distal CBD/ampullary stone. Pancreas: There are extensive retroperitoneal inflammatory changes however these do not appear focally centered upon the pancreas. The pancreas only appears only slightly edematous. No pancreatic ductal dilatation. Spleen: Normal in size without focal abnormality. Adrenals/Urinary Tract: Normal adrenal glands. Mild bilateral nonspecific perinephric stranding, a nonspecific finding though may correlate with either age or decreased renal function. Kidneys are otherwise unremarkable, without renal calculi, suspicious lesion, or hydronephrosis. Bladder is unremarkable. Stomach/Bowel: Distal esophagus is normal. The gastric antrum appears slightly thickened. There is mucosal hyperenhancement circumferential thickening of the second and third portions of the duodenal sweep with a questionable focus  of mural hypoattenuation (2/45) which may reflect a perforated duodenal ulcer. Remainder of the small bowel is unremarkable. A normal appendix is visualized. No colonic dilatation or wall thickening. Vascular/Lymphatic: Atherosclerotic plaque within the normal caliber aorta. Reactive adenopathy in the upper abdomen. No suspicious or enlarged lymph nodes in the included  lymphatic chains. Reproductive: The prostate and seminal vesicles are unremarkable. Other: No abdominopelvic free fluid or free gas. No bowel containing hernias. Musculoskeletal: Multilevel degenerative changes are present in the imaged portions of the spine. Findings are maximal at L5-S1 with slight retrolisthesis. Features of Baastrup's disease. IMPRESSION: 1. Extensive retroperitoneal inflammatory changes appear largely centered upon the thickened and hyperenhancing second and third portions of the duodenal sweep with a questionable focus of mural hypoattenuation (2/45) which is highly suspicious for a perforated duodenal ulcer. 2. There are additional peripancreatic inflammatory features which could reflect a reactive inflammatory process from the adjacent duodenal inflammation or an intrinsic acute edematous pancreatitis. 3. Small amount of pericholecystic fluid without gallbladder wall thickening or visible intraluminal gallstones. A punctate calcification is seen in the duodenum near the ampulla, unclear if this could reflect an ampullary stone. Could consider further evaluation with right upper quadrant ultrasound. 4. Mild bilateral nonspecific perinephric stranding, a nonspecific finding though may correlate with either age or decreased renal function. 5. Aortic Atherosclerosis (ICD10-I70.0). Electronically Signed   By: Kreg Shropshire M.D.   On: 05/07/2019 20:53   US Abdomen Limited Ruq  Result Date: 05/08/2019 CLINICAL DATA:  Right upper quadrant pain. EXAM: ULTRASOUND ABDOMEN LIMITED RIGHT UPPER QUADRANT COMPARISON:  10/15/2016 and abdominal CT 05/07/2019 FINDINGS: Gallbladder: No gallstones or wall thickening visualized. No sonographic Murphy sign noted by sonographer. Common bile duct: Diameter: 0.5 cm Liver: Increased echogenicity with mild heterogeneity. No focal lesion. No intrahepatic biliary dilatation. Portal vein is patent on color Doppler imaging with normal direction of blood flow towards the  liver. Other: Trace fluid around the liver. IMPRESSION: 1. Normal appearance of the gallbladder without stones. No biliary dilatation. 2. Liver is echogenic and slightly heterogeneous. Trace perihepatic fluid is similar to the recent CT findings. Electronically Signed   By: Richarda Overlie M.D.   On: 05/08/2019 07:49    Anti-infectives: Anti-infectives (From admission, onward)   Start     Dose/Rate Route Frequency Ordered Stop   05/09/19 0930  cefTRIAXone (ROCEPHIN) 2 g in sodium chloride 0.9 % 100 mL IVPB     2 g 200 mL/hr over 30 Minutes Intravenous Every 24 hours 05/09/19 0926         Assessment/Plan HTN HLD DM Leukocytosis - WBC up to 21K today, in light of all going on, agree with Dr. Randa Evens on initiating Rocephin  Pancreatitis, hyperbilirubinemia, possible contained duodenal perforation, unclear etiology -[place] did not show evidence of gallstones and has a normal caliber CBD.  Do not suspect this problem is secondary to a purely biliary source such as gallstones. -unclear etiology, but TB has increased to 9.8 today along with increase in WBC to 21K.  Agree with initiating abx therapy at this point. -will order MRCP to see if this gives a better picture of what all is going on.  Unable to do ERCP given appearance of duodenum and possible call for contained perforation. -no plans for surgical intervention at this point, but will continue to closely follow  FEN - NPO/IVFs VTE - Lovenox ID - Rocephin 8/26 -->   LOS: 2 days    Letha Cape , [facility] 05/09/2019, 9:38  AM Pager: [phone]

## 2019-05-09 NOTE — Progress Notes (Signed)
[facility] PROGRESS NOTE Subjective Patient states that he feels a little bit better.  He passed some gas.  Remains afebrile  Objective: Vital signs in last 24 hours: Temp:  [98.3 F (36.8 C)-99.2 F (37.3 C)] 98.6 F (37 C) (08/26 0835) Pulse Rate:  [92-100] 98 (08/26 0835) Resp:  [16-20] 16 (08/26 0445) BP: (123-144)/(80-91) 127/80 (08/26 0835) SpO2:  [93 %-94 %] 93 % (08/26 0835) Last BM Date: 05/07/19  Intake/Output from previous day: 08/25 0701 - 08/26 0700 In: 2823 [I.V.:2823] Out: 725 [Urine:725] Intake/Output this shift: No intake/output data recorded.  PE: General--alert talkative looks better  Abdomen--distended and still some tenderness but improved  Lab Results: Recent Labs    05/07/19 1611 05/08/19 0545 05/09/19 0224  WBC 12.6* 15.5* 21.4*  HGB 16.6 18.0* 17.4*  HCT 48.3 51.6 51.5  PLT 223 204 182   BMET Recent Labs    05/07/19 1611 05/08/19 0545 05/09/19 0224  NA 136 139 140  K 3.7 3.9 3.6  CL 102 104 108  CO2 25 22 19*  CREATININE 1.00 0.97 1.08   LFT Recent Labs    05/07/19 1611 05/08/19 0545 05/09/19 0224  PROT 7.7 7.2 6.3*  AST 514* 502* 213*  ALT 378* 497* 326*  ALKPHOS 91 162* 185*  BILITOT 3.1* 7.0* 9.3*   PT/INR No results for input(s): LABPROT, INR in the last 72 hours. PANCREAS Recent Labs    05/07/19 1611 05/08/19 0545 05/09/19 0224  LIPASE 6,889* 1,526* 824*         Studies/Results: Ct Abdomen Pelvis W Contrast  Result Date: 05/07/2019 CLINICAL DATA:  Abdominal pain, elevated LFTs, epigastric pain EXAM: CT ABDOMEN AND PELVIS WITH CONTRAST TECHNIQUE: Multidetector CT imaging of the abdomen and pelvis was performed using the standard protocol following bolus administration of intravenous contrast. CONTRAST:  100mL OMNIPAQUE IOHEXOL 300 MG/ML  SOLN COMPARISON:  Abdominal ultrasound 10/15/2016 FINDINGS: Lower chest: Atelectatic changes are present in the lung bases. Normal heart size. No pericardial  effusion. Hepatobiliary: No focal liver abnormality is seen. Small amount fluid within the gallbladder fossa. No frank gallbladder wall thickening. There is a punctate calcification near the level of the duodenum but this is unclear whether not this could reflect a distal CBD/ampullary stone. Pancreas: There are extensive retroperitoneal inflammatory changes however these do not appear focally centered upon the pancreas. The pancreas only appears only slightly edematous. No pancreatic ductal dilatation. Spleen: Normal in size without focal abnormality. Adrenals/Urinary Tract: Normal adrenal glands. Mild bilateral nonspecific perinephric stranding, a nonspecific finding though may correlate with either age or decreased renal function. Kidneys are otherwise unremarkable, without renal calculi, suspicious lesion, or hydronephrosis. Bladder is unremarkable. Stomach/Bowel: Distal esophagus is normal. The gastric antrum appears slightly thickened. There is mucosal hyperenhancement circumferential thickening of the second and third portions of the duodenal sweep with a questionable focus of mural hypoattenuation (2/45) which may reflect a perforated duodenal ulcer. Remainder of the small bowel is unremarkable. A normal appendix is visualized. No colonic dilatation or wall thickening. Vascular/Lymphatic: Atherosclerotic plaque within the normal caliber aorta. Reactive adenopathy in the upper abdomen. No suspicious or enlarged lymph nodes in the included lymphatic chains. Reproductive: The prostate and seminal vesicles are unremarkable. Other: No abdominopelvic free fluid or free gas. No bowel containing hernias. Musculoskeletal: Multilevel degenerative changes are present in the imaged portions of the spine. Findings are maximal at L5-S1 with slight retrolisthesis. Features of Baastrup's disease. IMPRESSION: 1. Extensive retroperitoneal inflammatory changes appear largely centered upon the thickened  and hyperenhancing  second and third portions of the duodenal sweep with a questionable focus of mural hypoattenuation (2/45) which is highly suspicious for a perforated duodenal ulcer. 2. There are additional peripancreatic inflammatory features which could reflect a reactive inflammatory process from the adjacent duodenal inflammation or an intrinsic acute edematous pancreatitis. 3. Small amount of pericholecystic fluid without gallbladder wall thickening or visible intraluminal gallstones. A punctate calcification is seen in the duodenum near the ampulla, unclear if this could reflect an ampullary stone. Could consider further evaluation with right upper quadrant ultrasound. 4. Mild bilateral nonspecific perinephric stranding, a nonspecific finding though may correlate with either age or decreased renal function. 5. Aortic Atherosclerosis (ICD10-I70.0). Electronically Signed   By: Lovena Le M.D.   On: 05/07/2019 20:53   US Abdomen Limited Ruq  Result Date: 05/08/2019 CLINICAL DATA:  Right upper quadrant pain. EXAM: ULTRASOUND ABDOMEN LIMITED RIGHT UPPER QUADRANT COMPARISON:  10/15/2016 and abdominal CT 05/07/2019 FINDINGS: Gallbladder: No gallstones or wall thickening visualized. No sonographic Murphy sign noted by sonographer. Common bile duct: Diameter: 0.5 cm Liver: Increased echogenicity with mild heterogeneity. No focal lesion. No intrahepatic biliary dilatation. Portal vein is patent on color Doppler imaging with normal direction of blood flow towards the liver. Other: Trace fluid around the liver. IMPRESSION: 1. Normal appearance of the gallbladder without stones. No biliary dilatation. 2. Liver is echogenic and slightly heterogeneous. Trace perihepatic fluid is similar to the recent CT findings. Electronically Signed   By: Markus Daft M.D.   On: 05/08/2019 07:49    Medications: I have reviewed the patient's current medications.  Assessment:   1.  Pancreatitis.  Etiology remains unclear.  On admission CT scan did  not show dilated ducts but showed a lot of inflammation around the duodenum question of perforated ulcer raised.  There was a question of a small calcification in the duodenum.  Ultrasound showed no gallstones and the CBD was 0.5 cm.  The patient's lipase has improved considerably but his WBC is up to 21.4 and transaminases remain elevated.  His bilirubin is risen to 9.3.  This is a somewhat confusing picture, however, I doubt that this is a routine ulcer since he had been on omeprazole for 10 days prior to his admission.     Plan: 1.  Agree with going ahead with an MRCP.  This will help [place] determine if there is any type of blockage to the CBD or PD. 2.  Due to his elevated WBC and TB, will empirically put him on antibiotics.  Rocephin started.   Nancy Fetter 05/09/2019, 9:21 AM  This note was created using voice recognition software. Minor errors may Have occurred unintentionally.  Pager: [phone] If no answer or after hours call [phone]

## 2019-05-10 ENCOUNTER — Inpatient Hospital Stay (HOSPITAL_COMMUNITY): Payer: 59

## 2019-05-10 LAB — CBC
HCT: 47.8 % (ref 39.0–52.0)
Hemoglobin: 15.7 g/dL (ref 13.0–17.0)
MCH: 29.8 pg (ref 26.0–34.0)
MCHC: 32.8 g/dL (ref 30.0–36.0)
MCV: 90.9 fL (ref 80.0–100.0)
Platelets: 143 10*3/uL — ABNORMAL LOW (ref 150–400)
RBC: 5.26 MIL/uL (ref 4.22–5.81)
RDW: 14 % (ref 11.5–15.5)
WBC: 15.1 10*3/uL — ABNORMAL HIGH (ref 4.0–10.5)
nRBC: 0 % (ref 0.0–0.2)

## 2019-05-10 LAB — LIPASE, BLOOD: Lipase: 277 U/L — ABNORMAL HIGH (ref 11–51)

## 2019-05-10 LAB — COMPREHENSIVE METABOLIC PANEL
ALT: 180 U/L — ABNORMAL HIGH (ref 0–44)
AST: 90 U/L — ABNORMAL HIGH (ref 15–41)
Albumin: 2.6 g/dL — ABNORMAL LOW (ref 3.5–5.0)
Alkaline Phosphatase: 140 U/L — ABNORMAL HIGH (ref 38–126)
Anion gap: 10 (ref 5–15)
BUN: 16 mg/dL (ref 8–23)
CO2: 24 mmol/L (ref 22–32)
Calcium: 6.8 mg/dL — ABNORMAL LOW (ref 8.9–10.3)
Chloride: 110 mmol/L (ref 98–111)
Creatinine, Ser: 1.13 mg/dL (ref 0.61–1.24)
GFR calc Af Amer: >60 mL/min (ref >60)
GFR calc non Af Amer: >60 mL/min (ref >60)
Glucose, Bld: 135 mg/dL — ABNORMAL HIGH (ref 70–99)
Potassium: 3.3 mmol/L — ABNORMAL LOW (ref 3.5–5.1)
Sodium: 144 mmol/L (ref 135–145)
Total Bilirubin: 2.7 mg/dL — ABNORMAL HIGH (ref 0.3–1.2)
Total Protein: 5.9 g/dL — ABNORMAL LOW (ref 6.5–8.1)

## 2019-05-10 LAB — GLUCOSE, CAPILLARY
Glucose-Capillary: 120 mg/dL — ABNORMAL HIGH (ref 70–99)
Glucose-Capillary: 125 mg/dL — ABNORMAL HIGH (ref 70–99)
Glucose-Capillary: 125 mg/dL — ABNORMAL HIGH (ref 70–99)
Glucose-Capillary: 126 mg/dL — ABNORMAL HIGH (ref 70–99)
Glucose-Capillary: 128 mg/dL — ABNORMAL HIGH (ref 70–99)
Glucose-Capillary: 133 mg/dL — ABNORMAL HIGH (ref 70–99)

## 2019-05-10 MED ORDER — GADOBUTROL 1 MMOL/ML IV SOLN
10.0000 mL | Freq: Once | INTRAVENOUS | Status: AC | PRN
  Administered 2019-05-10: 9 mL via INTRAVENOUS

## 2019-05-10 MED ORDER — POTASSIUM CHLORIDE 10 MEQ/100ML IV SOLN
10.0000 meq | INTRAVENOUS | Status: AC
  Administered 2019-05-10 (×4): 10 meq via INTRAVENOUS
  Filled 2019-05-10 (×4): qty 100

## 2019-05-10 MED ORDER — METOPROLOL TARTRATE 5 MG/5ML IV SOLN
2.5000 mg | Freq: Three times a day (TID) | INTRAVENOUS | Status: AC
  Administered 2019-05-10 – 2019-05-11 (×2): 2.5 mg via INTRAVENOUS
  Filled 2019-05-10 (×2): qty 5

## 2019-05-10 NOTE — Progress Notes (Signed)
PROGRESS NOTE  Mark Kidd YTK:[id number] DOB: July 18, 1956 DOA: 05/07/2019 PCP: Lorene Dy, MD  Brief History   Mark Kidd  is a 63 y.o. male, with history of diabetes mellitus type 2, hyperlipidemia, hypertension who came to hospital with epigastric pain which started last night.  Patient says that pain was very severe and kind of eased off around 1 AM.  In the morning though he felt better.  As soon as he ate food the pain returned after 1 hour.  He was also sweating and in extreme discomfort due to pain.  Patient says that he had a similar pain around August 10 at that time he was seen by his PCP and was prescribed omeprazole which did not help the pain.  He had one episode of vomiting in the hospital. He denies vomiting any blood. Denies black stool. Denies blood in the stool. Denies previous history of gallstones. Denies chest pain or shortness of breath. Denies fever or chills.  In the ED CT scan of the abdomen pelvis showed marked retroperitoneal inflammatory changes around second and third part of duodenum concerning for perforated peptic ulcer, also surrounding inflammation of pancreas.  Lipase significantly elevated to 6000, calcification noted at ampulla of greater concern for stone.  Both GI, Nandigam  and general surgery Dr Dema Severin were consulted at [facility] by Dr. Lacinda Axon.  They both will see patient as consult when patient reaches [facility].  No urgent surgery recommended as per conversation of Dr. Lacinda Axon and Dr. Dema Severin.  The patient has been admitted to a medical bed. He has continued to have abdominal pain. He has been seen by Dr. Dema Severin and Dr. Oletta Lamas.  On 05/09/2019 the patient had improvement in lipase, but increase in bilirubin and WBC. MRCP demonstrated no biliary obstruction. It does demonstrated that the inflammed pancreas appears to impinge on the CBD resulting in narrowing at the distal end.  Consultants  . General Surgery . Gastroenterology  Procedures  . MRCP   Antibiotics   Anti-infectives (From admission, onward)   Start     Dose/Rate Route Frequency Ordered Stop   05/09/19 0930  cefTRIAXone (ROCEPHIN) 2 g in sodium chloride 0.9 % 100 mL IVPB     2 g 200 mL/hr over 30 Minutes Intravenous Every 24 hours 05/09/19 0926       Subjective  The patient is resting in bed. He states that he is largely unchanged.  Objective   Vitals:  Vitals:   05/10/19 0413 05/10/19 1432  BP: (!) 148/85 (!) 160/89  Pulse: (!) 103 (!) 102  Resp: 18 18  Temp: 99 F (37.2 C) 99.9 F (37.7 C)  SpO2: 94% 95%    Exam:  Constitutional:  . The patient is resting comfortably. Mild distress from abdominal pain. Respiratory:  . No increased work of breathing. . No murmurs, ectopy or gallups. . No lateral PMI. No thrills. Cardiovascular:  . Regular rate and rhythm . No murmurs, ectopy, or gallups . No lateral PMI. No thrills. Abdomen:  . Abdomen is distended and diffusely tender. . No hernias, masses, or organomegaly. Marland Kitchen Positive for tympany with percussion. . Hypooactive bowel sounds.  Musculoskeletal:  . No cyanosis, clubbing, or edema Skin:  . No rashes, lesions, ulcers . palpation of skin: no induration or nodules Neurologic:  . CN 2-12 intact . Sensation all 4 extremities intact Psychiatric:  . Mental status o Mood, affect appropriate o Orientation to person, place, time  . judgment and insight appear intact  I have personally reviewed the following:   Today's Data  . Vitals, CBC, CMP  Scheduled Meds: . enoxaparin (LOVENOX) injection  40 mg Subcutaneous Q24H  . insulin aspart  0-9 Units Subcutaneous Q4H  . pantoprazole (PROTONIX) IV  40 mg Intravenous Q12H  . sucralfate  1 g Oral TID WC & HS   Continuous Infusions: . sodium chloride 150 mL/hr at 05/10/19 1328  . cefTRIAXone (ROCEPHIN)  IV 2 g (05/10/19 1309)    No problems updated.   LOS: 3 days   A & P  Perforated duodenal ulcer-as per CT scan abdomen pelvis as above,  patient is clinically stable.  Vitals are stable, does not have rigidity or guarding on abdominal examination.  General surgery has been consulted by ED physician, Dr. Dema Severin will see patient when patient arrives at [facility].  We will keep him n.p.o., dilaudid, IV normal saline at 125 mill per hour. General surgery wishes to treat this problem conservatively with close monitoring.  Acute pancreatitis-lipase significantly elevated 6889, AST 514, ALT 378, alk phos 91, total bilirubin 3.1. CT abdomen also shows punctate calcification in the duodenum near the ampulla which could reflect an ampullary stone.  GI has been consulted.  GI has seen the patient.  Patient will be kept n.p.o. as above.  Pain control and IV fluids as above. Lipase is down to 800's on 8/226/2020.    Hyperbilirubinemia: Bilirubin is down to 2.7 from 9.0. WBC down to 15.1. MRCP demonstrated no obstruction with biliary stone, however it appears that the inflammed pancreas may be narrowing the distal end of the duct.  Diabetes mellitus type 2-we will initiate sliding scale insulin with NovoLog. Check CBG every 4 hours.  Metformin on hold.  Hypertension-blood pressure stable, patient takes Coreg 6.25 mg every evening.  Will start metoprolol 2.5 mg IV every 8 hours   I have seen and examined this patient myself. I have spent 32 minutes in his evaluation and care.  DVT Prophylaxis:  Lovenox  Family Communication: The patient's wife is at bedside. Code Status: Full code Disposition: Home  Camillo Quadros, DO [facility] Direct contact: see [email or web address]  7PM-7AM contact night coverage as above 05/10/2019, 5:08 PM  LOS: 1 day

## 2019-05-10 NOTE — TOC Initial Note (Signed)
Transition of Care [facility]) - Initial/Assessment Note    Patient Details  Name: Mark Kidd MRN: [id number] Date of Birth: 1956-08-31  [facility] CM/SW Contact:    Marilu Favre, RN Phone Number: 05/10/2019, 12:33 PM  Clinical Narrative:                 No discharge needs identified. Will continue to follow  Expected Discharge Plan: Home/Self Care Barriers to Discharge: Continued Medical Work up   Patient Goals and CMS Choice Patient states their goals for this hospitalization and ongoing recovery are:: to go home CMS Medicare.gov Compare Post Acute Care list provided to:: Patient Choice offered to / list presented to : NA  Expected Discharge Plan and Services Expected Discharge Plan: Home/Self Care       Living arrangements for the past 2 months: Single Family Home                 DME Arranged: N/A         HH Arranged: NA          Prior Living Arrangements/Services Living arrangements for the past 2 months: Single Family Home Lives with:: Spouse Patient language and need for interpreter reviewed:: Yes Do you feel safe going back to the place where you live?: Yes      Need for Family Participation in Patient Care: No (Comment) Care giver support system in place?: Yes (comment)   Criminal Activity/Legal Involvement Pertinent to Current Situation/Hospitalization: No - Comment as needed  Activities of Daily Living Home Assistive Devices/Equipment: CBG Meter ADL Screening (condition at time of admission) Patient's cognitive ability adequate to safely complete daily activities?: Yes Is the patient deaf or have difficulty hearing?: No Does the patient have difficulty seeing, even when wearing glasses/contacts?: No Does the patient have difficulty concentrating, remembering, or making decisions?: No Patient able to express need for assistance with ADLs?: Yes Does the patient have difficulty dressing or bathing?: No Independently performs ADLs?: Yes  (appropriate for developmental age) Does the patient have difficulty walking or climbing stairs?: No Weakness of Legs: None Weakness of Arms/Hands: None  Permission Sought/Granted   Permission granted to share information with : No              Emotional Assessment Appearance:: Appears stated age Attitude/Demeanor/Rapport: Engaged Affect (typically observed): Accepting Orientation: : Oriented to Self, Oriented to Place, Oriented to  Time, Oriented to Situation      Admission diagnosis:  Elevated lipase [R74.8] Patient Active Problem List   Diagnosis Date Noted  . Duodenal ulcer 05/08/2019  . DM II (diabetes mellitus, type II), controlled ([place]) 05/08/2019  . Essential hypertension 05/08/2019  . Pancreatitis 05/07/2019   PCP:  Lorene Dy, MD Pharmacy:   [place], [place] - [place] [address] [place] [address] [place] [address] Phone: [phone] Fax: [phone]     Social Determinants of Health (SDOH) Interventions    Readmission Risk Interventions No flowsheet data found.

## 2019-05-10 NOTE — Progress Notes (Signed)
[facility] PROGRESS NOTE Subjective Patient still not feeling well pain is moved more to the left upper quadrant is passing gas but no bowel movement.  Feels bloated.  MRCP report shows some small pleural effusions with bilateral atelectasis.  No gallstones little sludge in the gallbladder with some mild wall thickening narrowing of the intrapancreatic portion of the distal bile duct no filling defects.  Feeling was there was no choledocholithiasis.  Nonnecrotizing pancreatitis.  Objective: Vital signs in last 24 hours: Temp:  [98 F (36.7 C)-99.9 F (37.7 C)] 99.9 F (37.7 C) (08/27 1432) Pulse Rate:  [102-105] 102 (08/27 1432) Resp:  [18] 18 (08/27 1432) BP: (125-160)/(74-89) 160/89 (08/27 1432) SpO2:  [93 %-95 %] 95 % (08/27 1432) Last BM Date: 05/07/19  Intake/Output from previous day: 08/26 0701 - 08/27 0700 In: 3074.9 [I.V.:2974.9; IV Piggyback:100] Out: -  Intake/Output this shift: No intake/output data recorded.  PE: General--patient ambulating states that he does not feel good  Abdomen--slightly distended but few bowel sounds mild tenderness across the upper abdomen  Lab Results: Recent Labs    05/07/19 1611 05/08/19 0545 05/09/19 0224 05/10/19 0250  WBC 12.6* 15.5* 21.4* 15.1*  HGB 16.6 18.0* 17.4* 15.7  HCT 48.3 51.6 51.5 47.8  PLT 223 204 182 143*   BMET Recent Labs    05/07/19 1611 05/08/19 0545 05/09/19 0224 05/10/19 0250  NA 136 139 140 144  K 3.7 3.9 3.6 3.3*  CL 102 104 108 110  CO2 25 22 19* 24  CREATININE 1.00 0.97 1.08 1.13   LFT Recent Labs    05/08/19 0545 05/09/19 0224 05/10/19 0250  PROT 7.2 6.6  6.3* 5.9*  AST 502* 214*  213* 90*  ALT 497* 320*  326* 180*  ALKPHOS 162* 191*  185* 140*  BILITOT 7.0* 8.7*  9.3* 2.7*  BILIDIR  --  5.4*  --   IBILI  --  3.3*  --    PT/INR No results for input(s): LABPROT, INR in the last 72 hours. PANCREAS Recent Labs    05/08/19 0545 05/09/19 0224 05/10/19 0250  LIPASE  1,526* 824* 277*         Studies/Results: Mr 3d Recon At Scanner  Result Date: 05/10/2019 CLINICAL DATA:  Inpatient. Abdominal pain, severe pancreatitis, hyperbilirubinemia. EXAM: MRI ABDOMEN WITHOUT AND WITH CONTRAST (INCLUDING MRCP) TECHNIQUE: Multiplanar multisequence MR imaging of the abdomen was performed both before and after the administration of intravenous contrast. Heavily T2-weighted images of the biliary and pancreatic ducts were obtained, and three-dimensional MRCP images were rendered by post processing. CONTRAST:  9 cc Gadavist IV. COMPARISON:  05/07/2019 CT abdomen/pelvis. 05/08/2019 abdominal sonogram. FINDINGS: Lower chest: Small dependent bilateral pleural effusions with moderate dependent bibasilar atelectasis. Hepatobiliary: Normal liver size and configuration. Mild diffuse hepatic steatosis. No liver mass. No cholelithiasis. Layering sludge in the gallbladder. Mild diffuse gallbladder wall thickening. No significant pericholecystic fluid. No biliary ductal dilatation. Common bile duct diameter 5 mm. Smooth narrowing of the intrapancreatic portion of the distal common bile duct. No filling defects within the biliary tree to suggest choledocholithiasis. No discrete biliary stricture. No biliary beading or masses. Pancreas: There is mild diffuse pancreatic and peripancreatic edema, compatible with acute pancreatitis. Retroperitoneal edema extends into anterior paranephric spaces bilaterally, left greater than right. Preserved pancreatic parenchymal enhancement. No pancreatic mass or duct dilation. No pancreas divisum. No peripancreatic fluid collections. Spleen: Normal size. No mass. Adrenals/Urinary Tract: Normal adrenals. No hydronephrosis. Normal kidneys with no renal mass. Stomach/Bowel: Normal non-distended stomach. Visualized  small and large bowel is normal caliber, with no bowel wall thickening. Vascular/Lymphatic: Normal caliber abdominal aorta. Patent portal, splenic, hepatic  and renal veins. No pathologically enlarged lymph nodes in the abdomen. Other: Trace perihepatic and perisplenic ascites. No focal fluid collections. Musculoskeletal: No aggressive appearing focal osseous lesions. IMPRESSION: 1. Acute uncomplicated non necrotizing pancreatitis. 2. No cholelithiasis. No biliary ductal dilatation. No choledocholithiasis. Smooth extrinsic narrowing of the distal CBD by the inflamed pancreas. 3. Small bilateral pleural effusions with bibasilar atelectasis. 4. Trace perihepatic and perisplenic ascites. Electronically Signed   By: Delbert PhenixJason A Poff M.D.   On: 05/10/2019 11:20   Mr Abdomen Mrcp Vivien RossettiW Wo Contast  Result Date: 05/10/2019 CLINICAL DATA:  Inpatient. Abdominal pain, severe pancreatitis, hyperbilirubinemia. EXAM: MRI ABDOMEN WITHOUT AND WITH CONTRAST (INCLUDING MRCP) TECHNIQUE: Multiplanar multisequence MR imaging of the abdomen was performed both before and after the administration of intravenous contrast. Heavily T2-weighted images of the biliary and pancreatic ducts were obtained, and three-dimensional MRCP images were rendered by post processing. CONTRAST:  9 cc Gadavist IV. COMPARISON:  05/07/2019 CT abdomen/pelvis. 05/08/2019 abdominal sonogram. FINDINGS: Lower chest: Small dependent bilateral pleural effusions with moderate dependent bibasilar atelectasis. Hepatobiliary: Normal liver size and configuration. Mild diffuse hepatic steatosis. No liver mass. No cholelithiasis. Layering sludge in the gallbladder. Mild diffuse gallbladder wall thickening. No significant pericholecystic fluid. No biliary ductal dilatation. Common bile duct diameter 5 mm. Smooth narrowing of the intrapancreatic portion of the distal common bile duct. No filling defects within the biliary tree to suggest choledocholithiasis. No discrete biliary stricture. No biliary beading or masses. Pancreas: There is mild diffuse pancreatic and peripancreatic edema, compatible with acute pancreatitis. Retroperitoneal  edema extends into anterior paranephric spaces bilaterally, left greater than right. Preserved pancreatic parenchymal enhancement. No pancreatic mass or duct dilation. No pancreas divisum. No peripancreatic fluid collections. Spleen: Normal size. No mass. Adrenals/Urinary Tract: Normal adrenals. No hydronephrosis. Normal kidneys with no renal mass. Stomach/Bowel: Normal non-distended stomach. Visualized small and large bowel is normal caliber, with no bowel wall thickening. Vascular/Lymphatic: Normal caliber abdominal aorta. Patent portal, splenic, hepatic and renal veins. No pathologically enlarged lymph nodes in the abdomen. Other: Trace perihepatic and perisplenic ascites. No focal fluid collections. Musculoskeletal: No aggressive appearing focal osseous lesions. IMPRESSION: 1. Acute uncomplicated non necrotizing pancreatitis. 2. No cholelithiasis. No biliary ductal dilatation. No choledocholithiasis. Smooth extrinsic narrowing of the distal CBD by the inflamed pancreas. 3. Small bilateral pleural effusions with bibasilar atelectasis. 4. Trace perihepatic and perisplenic ascites. Electronically Signed   By: Delbert PhenixJason A Poff M.D.   On: 05/10/2019 11:20    Medications: I have reviewed the patient's current medications.  Assessment:   1.  Pancreatitis.  MRCP shows no CBD stones.  Labs all improving marked improvement in total bilirubin.  Hopefully he will begin to turn around.   Plan: Would continue n.p.o. and antibiotics for now.   Tresea MallJames L Simora Dingee 05/10/2019, 3:22 PM  This note was created using voice recognition software. Minor errors may Have occurred unintentionally.  Pager: [phone] If no answer or after hours call [phone]

## 2019-05-10 NOTE — Progress Notes (Signed)
Patient ID: Mark Kidd, male   DOB: 1956/05/29, 63 y.o.   MRN: [id number]       Subjective: Feels more bloated today.  Having some SOB today, he thinks because of abdominal distention, more pain on left side of abdomen.  No nausea.  Still with some flatus.  Objective: Vital signs in last 24 hours: Temp:  [98 F (36.7 C)-99.3 F (37.4 C)] 99 F (37.2 C) (08/27 0413) Pulse Rate:  [101-105] 103 (08/27 0413) Resp:  [18] 18 (08/27 0413) BP: (125-148)/(74-85) 148/85 (08/27 0413) SpO2:  [93 %-100 %] 94 % (08/27 0413) Last BM Date: 05/07/19  Intake/Output from previous day: 08/26 0701 - 08/27 0700 In: 3074.9 [I.V.:2974.9; IV Piggyback:100] Out: -  Intake/Output this shift: No intake/output data recorded.  PE: Heart: regular Lungs: CTAB Abd: distended, tympanitic, some BS, tender on left side of upper abdomen and epigastrium.  No tenderness in RUQ.  Lab Results:  Recent Labs    05/09/19 0224 05/10/19 0250  WBC 21.4* 15.1*  HGB 17.4* 15.7  HCT 51.5 47.8  PLT 182 143*   BMET Recent Labs    05/09/19 0224 05/10/19 0250  NA 140 144  K 3.6 3.3*  CL 108 110  CO2 19* 24  GLUCOSE 142* 135*  BUN 17 16  CREATININE 1.08 1.13  CALCIUM 7.5* 6.8*   PT/INR No results for input(s): LABPROT, INR in the last 72 hours. CMP     Component Value Date/Time   NA 144 05/10/2019 0250   K 3.3 (L) 05/10/2019 0250   CL 110 05/10/2019 0250   CO2 24 05/10/2019 0250   GLUCOSE 135 (H) 05/10/2019 0250   BUN 16 05/10/2019 0250   CREATININE 1.13 05/10/2019 0250   CALCIUM 6.8 (L) 05/10/2019 0250   PROT 5.9 (L) 05/10/2019 0250   ALBUMIN 2.6 (L) 05/10/2019 0250   AST 90 (H) 05/10/2019 0250   ALT 180 (H) 05/10/2019 0250   ALKPHOS 140 (H) 05/10/2019 0250   BILITOT 2.7 (H) 05/10/2019 0250   GFRNONAA >60 05/10/2019 0250   GFRAA >60 05/10/2019 0250   Lipase     Component Value Date/Time   LIPASE 277 (H) 05/10/2019 0250       Studies/Results: No results found.  Anti-infectives:  Anti-infectives (From admission, onward)   Start     Dose/Rate Route Frequency Ordered Stop   05/09/19 0930  cefTRIAXone (ROCEPHIN) 2 g in sodium chloride 0.9 % 100 mL IVPB     2 g 200 mL/hr over 30 Minutes Intravenous Every 24 hours 05/09/19 0926         Assessment/Plan HTN HLD DM Leukocytosis - WBC back down to 15K today   Pancreatitis, hyperbilirubinemia, possible contained duodenal perforation, unclear etiology -[place] did not show evidence of gallstones and has a normal caliber CBD.  Do not suspect this problem is secondary to a purely biliary source such as gallstones. -TB down to 2.8 this am from 9 yesterday. -MRCP still pending.  Called down to MRI and they stated it would be done this morning.... -no plans for surgical intervention at this point, but will continue to closely follow -likely developing an ileus, but with no nausea currently.  FEN - NPO/IVFs VTE - Lovenox ID - Rocephin 8/26 -->   LOS: 3 days    Henreitta Cea , [facility] 05/10/2019, 8:46 AM Pager: [phone]

## 2019-05-10 NOTE — Plan of Care (Signed)
  Problem: Education: Goal: Knowledge of General Education information will improve Description Including pain rating scale, medication(s)/side effects and non-pharmacologic comfort measures Outcome: Progressing   Problem: Health Behavior/Discharge Planning: Goal: Ability to manage health-related needs will improve Outcome: Progressing   Problem: Clinical Measurements: Goal: Ability to maintain clinical measurements within normal limits will improve Outcome: Progressing Goal: Will remain free from infection Outcome: Progressing   Problem: Activity: Goal: Risk for activity intolerance will decrease Outcome: Progressing   Problem: Coping: Goal: Level of anxiety will decrease Outcome: Progressing   Problem: Elimination: Goal: Will not experience complications related to bowel motility Outcome: Progressing Goal: Will not experience complications related to urinary retention Outcome: Progressing   Problem: Pain Managment: Goal: General experience of comfort will improve Outcome: Progressing   Problem: Safety: Goal: Ability to remain free from injury will improve Outcome: Progressing   Problem: Skin Integrity: Goal: Risk for impaired skin integrity will decrease Outcome: Progressing   

## 2019-05-10 NOTE — Plan of Care (Signed)

## 2019-05-11 LAB — CBC WITH DIFFERENTIAL/PLATELET
Abs Immature Granulocytes: 0.06 10*3/uL (ref 0.00–0.07)
Basophils Absolute: 0 10*3/uL (ref 0.0–0.1)
Basophils Relative: 0 %
Eosinophils Absolute: 0 10*3/uL (ref 0.0–0.5)
Eosinophils Relative: 0 %
HCT: 40.6 % (ref 39.0–52.0)
Hemoglobin: 13.9 g/dL (ref 13.0–17.0)
Immature Granulocytes: 1 %
Lymphocytes Relative: 5 %
Lymphs Abs: 0.5 10*3/uL — ABNORMAL LOW (ref 0.7–4.0)
MCH: 30.5 pg (ref 26.0–34.0)
MCHC: 34.2 g/dL (ref 30.0–36.0)
MCV: 89 fL (ref 80.0–100.0)
Monocytes Absolute: 0.7 10*3/uL (ref 0.1–1.0)
Monocytes Relative: 6 %
Neutro Abs: 10.5 10*3/uL — ABNORMAL HIGH (ref 1.7–7.7)
Neutrophils Relative %: 88 %
Platelets: 138 10*3/uL — ABNORMAL LOW (ref 150–400)
RBC: 4.56 MIL/uL (ref 4.22–5.81)
RDW: 13.9 % (ref 11.5–15.5)
WBC: 11.8 10*3/uL — ABNORMAL HIGH (ref 4.0–10.5)
nRBC: 0 % (ref 0.0–0.2)

## 2019-05-11 LAB — COMPREHENSIVE METABOLIC PANEL
ALT: 103 U/L — ABNORMAL HIGH (ref 0–44)
AST: 58 U/L — ABNORMAL HIGH (ref 15–41)
Albumin: 2.4 g/dL — ABNORMAL LOW (ref 3.5–5.0)
Alkaline Phosphatase: 96 U/L (ref 38–126)
Anion gap: 8 (ref 5–15)
BUN: 13 mg/dL (ref 8–23)
CO2: 23 mmol/L (ref 22–32)
Calcium: 7 mg/dL — ABNORMAL LOW (ref 8.9–10.3)
Chloride: 112 mmol/L — ABNORMAL HIGH (ref 98–111)
Creatinine, Ser: 0.91 mg/dL (ref 0.61–1.24)
GFR calc Af Amer: >60 mL/min (ref >60)
GFR calc non Af Amer: >60 mL/min (ref >60)
Glucose, Bld: 143 mg/dL — ABNORMAL HIGH (ref 70–99)
Potassium: 3 mmol/L — ABNORMAL LOW (ref 3.5–5.1)
Sodium: 143 mmol/L (ref 135–145)
Total Bilirubin: 2.1 mg/dL — ABNORMAL HIGH (ref 0.3–1.2)
Total Protein: 5.4 g/dL — ABNORMAL LOW (ref 6.5–8.1)

## 2019-05-11 LAB — GLUCOSE, CAPILLARY
Glucose-Capillary: 118 mg/dL — ABNORMAL HIGH (ref 70–99)
Glucose-Capillary: 131 mg/dL — ABNORMAL HIGH (ref 70–99)
Glucose-Capillary: 118 mg/dL — ABNORMAL HIGH (ref 70–99)
Glucose-Capillary: 176 mg/dL — ABNORMAL HIGH (ref 70–99)
Glucose-Capillary: 154 mg/dL — ABNORMAL HIGH (ref 70–99)

## 2019-05-11 LAB — LIPASE, BLOOD: Lipase: 63 U/L — ABNORMAL HIGH (ref 11–51)

## 2019-05-11 MED ORDER — POTASSIUM CHLORIDE 10 MEQ/100ML IV SOLN
10.0000 meq | INTRAVENOUS | Status: AC
  Administered 2019-05-11 (×4): 10 meq via INTRAVENOUS
  Filled 2019-05-11 (×4): qty 100

## 2019-05-11 MED ORDER — POTASSIUM CHLORIDE 10 MEQ/100ML IV SOLN: 10.0000 meq | INTRAVENOUS | Status: DC

## 2019-05-11 NOTE — Plan of Care (Signed)
  Problem: Education: Goal: Knowledge of General Education information will improve Description: Including pain rating scale, medication(s)/side effects and non-pharmacologic comfort measures Outcome: Progressing   Problem: Health Behavior/Discharge Planning: Goal: Ability to manage health-related needs will improve Outcome: Progressing   Problem: Clinical Measurements: Goal: Ability to maintain clinical measurements within normal limits will improve Outcome: Progressing Goal: Respiratory complications will improve Outcome: Progressing Goal: Cardiovascular complication will be avoided Outcome: Progressing   Problem: Activity: Goal: Risk for activity intolerance will decrease Outcome: Progressing   Problem: Elimination: Goal: Will not experience complications related to urinary retention Outcome: Progressing   Problem: Pain Managment: Goal: General experience of comfort will improve Outcome: Progressing   Problem: Safety: Goal: Ability to remain free from injury will improve Outcome: Progressing   Problem: Skin Integrity: Goal: Risk for impaired skin integrity will decrease Outcome: Progressing   

## 2019-05-11 NOTE — Progress Notes (Signed)
[facility] PROGRESS NOTE Subjective Patient continuing to feel better is had good bowel movements lots of gas.  Is urinating well.  He is on a liquid diet and seems to be improving.  Objective: Vital signs in last 24 hours: Temp:  [98 F (36.7 C)-99 F (37.2 C)] 99 F (37.2 C) (08/28 1447) Pulse Rate:  [91-109] 101 (08/28 1447) Resp:  [16-20] 20 (08/28 1447) BP: (164-165)/(89-91) 165/90 (08/28 1447) SpO2:  [94 %-100 %] 97 % (08/28 1447) Last BM Date: 05/10/19  Intake/Output from previous day: 08/27 0701 - 08/28 0700 In: 3291.7 [P.O.:20; I.V.:2971.2; IV Piggyback:300.6] Out: -  Intake/Output this shift: Total I/O In: 240 [P.O.:240] Out: -   PE: General--no distress watching television  Abdomen--nontender, soft good bowel sounds Lab Results: Recent Labs    05/09/19 0224 05/10/19 0250 05/11/19 0558  WBC 21.4* 15.1* 11.8*  HGB 17.4* 15.7 13.9  HCT 51.5 47.8 40.6  PLT 182 143* 138*   BMET Recent Labs    05/09/19 0224 05/10/19 0250 05/11/19 0558  NA 140 144 143  K 3.6 3.3* 3.0*  CL 108 110 112*  CO2 19* 24 23  CREATININE 1.08 1.13 0.91   LFT Recent Labs    05/09/19 0224 05/10/19 0250 05/11/19 0558  PROT 6.6  6.3* 5.9* 5.4*  AST 214*  213* 90* 58*  ALT 320*  326* 180* 103*  ALKPHOS 191*  185* 140* 96  BILITOT 8.7*  9.3* 2.7* 2.1*  BILIDIR 5.4*  --   --   IBILI 3.3*  --   --    PT/INR No results for input(s): LABPROT, INR in the last 72 hours. PANCREAS Recent Labs    05/09/19 0224 05/10/19 0250 05/11/19 0558  LIPASE 824* 277* 63*         Studies/Results: Mr 3d Recon At Scanner  Result Date: 05/10/2019 CLINICAL DATA:  Inpatient. Abdominal pain, severe pancreatitis, hyperbilirubinemia. EXAM: MRI ABDOMEN WITHOUT AND WITH CONTRAST (INCLUDING MRCP) TECHNIQUE: Multiplanar multisequence MR imaging of the abdomen was performed both before and after the administration of intravenous contrast. Heavily T2-weighted images of the  biliary and pancreatic ducts were obtained, and three-dimensional MRCP images were rendered by post processing. CONTRAST:  9 cc Gadavist IV. COMPARISON:  05/07/2019 CT abdomen/pelvis. 05/08/2019 abdominal sonogram. FINDINGS: Lower chest: Small dependent bilateral pleural effusions with moderate dependent bibasilar atelectasis. Hepatobiliary: Normal liver size and configuration. Mild diffuse hepatic steatosis. No liver mass. No cholelithiasis. Layering sludge in the gallbladder. Mild diffuse gallbladder wall thickening. No significant pericholecystic fluid. No biliary ductal dilatation. Common bile duct diameter 5 mm. Smooth narrowing of the intrapancreatic portion of the distal common bile duct. No filling defects within the biliary tree to suggest choledocholithiasis. No discrete biliary stricture. No biliary beading or masses. Pancreas: There is mild diffuse pancreatic and peripancreatic edema, compatible with acute pancreatitis. Retroperitoneal edema extends into anterior paranephric spaces bilaterally, left greater than right. Preserved pancreatic parenchymal enhancement. No pancreatic mass or duct dilation. No pancreas divisum. No peripancreatic fluid collections. Spleen: Normal size. No mass. Adrenals/Urinary Tract: Normal adrenals. No hydronephrosis. Normal kidneys with no renal mass. Stomach/Bowel: Normal non-distended stomach. Visualized small and large bowel is normal caliber, with no bowel wall thickening. Vascular/Lymphatic: Normal caliber abdominal aorta. Patent portal, splenic, hepatic and renal veins. No pathologically enlarged lymph nodes in the abdomen. Other: Trace perihepatic and perisplenic ascites. No focal fluid collections. Musculoskeletal: No aggressive appearing focal osseous lesions. IMPRESSION: 1. Acute uncomplicated non necrotizing pancreatitis. 2. No cholelithiasis. No biliary ductal dilatation.  No choledocholithiasis. Smooth extrinsic narrowing of the distal CBD by the inflamed  pancreas. 3. Small bilateral pleural effusions with bibasilar atelectasis. 4. Trace perihepatic and perisplenic ascites. Electronically Signed   By: Delbert PhenixJason A Poff M.D.   On: 05/10/2019 11:20   Mr Abdomen Mrcp Vivien RossettiW Wo Contast  Result Date: 05/10/2019 CLINICAL DATA:  Inpatient. Abdominal pain, severe pancreatitis, hyperbilirubinemia. EXAM: MRI ABDOMEN WITHOUT AND WITH CONTRAST (INCLUDING MRCP) TECHNIQUE: Multiplanar multisequence MR imaging of the abdomen was performed both before and after the administration of intravenous contrast. Heavily T2-weighted images of the biliary and pancreatic ducts were obtained, and three-dimensional MRCP images were rendered by post processing. CONTRAST:  9 cc Gadavist IV. COMPARISON:  05/07/2019 CT abdomen/pelvis. 05/08/2019 abdominal sonogram. FINDINGS: Lower chest: Small dependent bilateral pleural effusions with moderate dependent bibasilar atelectasis. Hepatobiliary: Normal liver size and configuration. Mild diffuse hepatic steatosis. No liver mass. No cholelithiasis. Layering sludge in the gallbladder. Mild diffuse gallbladder wall thickening. No significant pericholecystic fluid. No biliary ductal dilatation. Common bile duct diameter 5 mm. Smooth narrowing of the intrapancreatic portion of the distal common bile duct. No filling defects within the biliary tree to suggest choledocholithiasis. No discrete biliary stricture. No biliary beading or masses. Pancreas: There is mild diffuse pancreatic and peripancreatic edema, compatible with acute pancreatitis. Retroperitoneal edema extends into anterior paranephric spaces bilaterally, left greater than right. Preserved pancreatic parenchymal enhancement. No pancreatic mass or duct dilation. No pancreas divisum. No peripancreatic fluid collections. Spleen: Normal size. No mass. Adrenals/Urinary Tract: Normal adrenals. No hydronephrosis. Normal kidneys with no renal mass. Stomach/Bowel: Normal non-distended stomach. Visualized small  and large bowel is normal caliber, with no bowel wall thickening. Vascular/Lymphatic: Normal caliber abdominal aorta. Patent portal, splenic, hepatic and renal veins. No pathologically enlarged lymph nodes in the abdomen. Other: Trace perihepatic and perisplenic ascites. No focal fluid collections. Musculoskeletal: No aggressive appearing focal osseous lesions. IMPRESSION: 1. Acute uncomplicated non necrotizing pancreatitis. 2. No cholelithiasis. No biliary ductal dilatation. No choledocholithiasis. Smooth extrinsic narrowing of the distal CBD by the inflamed pancreas. 3. Small bilateral pleural effusions with bibasilar atelectasis. 4. Trace perihepatic and perisplenic ascites. Electronically Signed   By: Delbert PhenixJason A Poff M.D.   On: 05/10/2019 11:20    Medications: I have reviewed the patient's current medications.  Assessment:   1.  Acute pancreatitis.  Etiology is somewhat unclear but I did review all of his films with radiology.  On his initial CT scan there was a calcification in the duodenum that could have well been a stone impacted in the ampulla.  This was not evident on the MRI and a lot of the duodenal edema been resolved.  I suspect he had a single gallstone that he passed.   Plan: Would advance diet tomorrow if he continues to improve. Would have him follow-up in the office with Dr. Bosie ClosSchooler in about 2 to 3 weeks.  After his pancreatitis is fully resolved we may want to do another gallbladder ultrasound.   Mark MallJames L Rondo Kidd 05/11/2019, 3:29 PM  This note was created using voice recognition software. Minor errors may Have occurred unintentionally.  Pager: [phone] If no answer or after hours call [phone]

## 2019-05-11 NOTE — Progress Notes (Signed)
Patient ID: Mark Kidd, male   DOB: 1956-03-17, 63 y.o.   MRN: [id number]       Subjective: Patient up walking in the halls today.  States he feels a little better today pain wise.  Feels more tired.  Is wondering about being able to eat something.  Had a BM.  No nausea.  Objective: Vital signs in last 24 hours: Temp:  [98 F (36.7 C)-99.9 F (37.7 C)] 98.8 F (37.1 C) (08/28 0455) Pulse Rate:  [91-109] 91 (08/28 0455) Resp:  [16-18] 18 (08/28 0455) BP: (160-164)/(89-91) 164/89 (08/28 0455) SpO2:  [94 %-100 %] 94 % (08/28 0455) Last BM Date: 05/10/19  Intake/Output from previous day: 08/27 0701 - 08/28 0700 In: 3291.7 [P.O.:20; I.V.:2971.2; IV Piggyback:300.6] Out: -  Intake/Output this shift: No intake/output data recorded.  PE: Abd: soft, but still bloated, some BS, tenderness is less in LUQ and epigastrium today  Lab Results:  Recent Labs    05/10/19 0250 05/11/19 0558  WBC 15.1* 11.8*  HGB 15.7 13.9  HCT 47.8 40.6  PLT 143* 138*   BMET Recent Labs    05/10/19 0250 05/11/19 0558  NA 144 143  K 3.3* 3.0*  CL 110 112*  CO2 24 23  GLUCOSE 135* 143*  BUN 16 13  CREATININE 1.13 0.91  CALCIUM 6.8* 7.0*   PT/INR No results for input(s): LABPROT, INR in the last 72 hours. CMP     Component Value Date/Time   NA 143 05/11/2019 0558   K 3.0 (L) 05/11/2019 0558   CL 112 (H) 05/11/2019 0558   CO2 23 05/11/2019 0558   GLUCOSE 143 (H) 05/11/2019 0558   BUN 13 05/11/2019 0558   CREATININE 0.91 05/11/2019 0558   CALCIUM 7.0 (L) 05/11/2019 0558   PROT 5.4 (L) 05/11/2019 0558   ALBUMIN 2.4 (L) 05/11/2019 0558   AST 58 (H) 05/11/2019 0558   ALT 103 (H) 05/11/2019 0558   ALKPHOS 96 05/11/2019 0558   BILITOT 2.1 (H) 05/11/2019 0558   GFRNONAA >60 05/11/2019 0558   GFRAA >60 05/11/2019 0558   Lipase     Component Value Date/Time   LIPASE 63 (H) 05/11/2019 0558       Studies/Results: Mr 3d Recon At Scanner  Result Date: 05/10/2019 CLINICAL DATA:   Inpatient. Abdominal pain, severe pancreatitis, hyperbilirubinemia. EXAM: MRI ABDOMEN WITHOUT AND WITH CONTRAST (INCLUDING MRCP) TECHNIQUE: Multiplanar multisequence MR imaging of the abdomen was performed both before and after the administration of intravenous contrast. Heavily T2-weighted images of the biliary and pancreatic ducts were obtained, and three-dimensional MRCP images were rendered by post processing. CONTRAST:  9 cc Gadavist IV. COMPARISON:  05/07/2019 CT abdomen/pelvis. 05/08/2019 abdominal sonogram. FINDINGS: Lower chest: Small dependent bilateral pleural effusions with moderate dependent bibasilar atelectasis. Hepatobiliary: Normal liver size and configuration. Mild diffuse hepatic steatosis. No liver mass. No cholelithiasis. Layering sludge in the gallbladder. Mild diffuse gallbladder wall thickening. No significant pericholecystic fluid. No biliary ductal dilatation. Common bile duct diameter 5 mm. Smooth narrowing of the intrapancreatic portion of the distal common bile duct. No filling defects within the biliary tree to suggest choledocholithiasis. No discrete biliary stricture. No biliary beading or masses. Pancreas: There is mild diffuse pancreatic and peripancreatic edema, compatible with acute pancreatitis. Retroperitoneal edema extends into anterior paranephric spaces bilaterally, left greater than right. Preserved pancreatic parenchymal enhancement. No pancreatic mass or duct dilation. No pancreas divisum. No peripancreatic fluid collections. Spleen: Normal size. No mass. Adrenals/Urinary Tract: Normal adrenals. No hydronephrosis. Normal kidneys with  no renal mass. Stomach/Bowel: Normal non-distended stomach. Visualized small and large bowel is normal caliber, with no bowel wall thickening. Vascular/Lymphatic: Normal caliber abdominal aorta. Patent portal, splenic, hepatic and renal veins. No pathologically enlarged lymph nodes in the abdomen. Other: Trace perihepatic and perisplenic  ascites. No focal fluid collections. Musculoskeletal: No aggressive appearing focal osseous lesions. IMPRESSION: 1. Acute uncomplicated non necrotizing pancreatitis. 2. No cholelithiasis. No biliary ductal dilatation. No choledocholithiasis. Smooth extrinsic narrowing of the distal CBD by the inflamed pancreas. 3. Small bilateral pleural effusions with bibasilar atelectasis. 4. Trace perihepatic and perisplenic ascites. Electronically Signed   By: Ilona Sorrel M.D.   On: 05/10/2019 11:20   Mr Abdomen Mrcp Moise Boring Contast  Result Date: 05/10/2019 CLINICAL DATA:  Inpatient. Abdominal pain, severe pancreatitis, hyperbilirubinemia. EXAM: MRI ABDOMEN WITHOUT AND WITH CONTRAST (INCLUDING MRCP) TECHNIQUE: Multiplanar multisequence MR imaging of the abdomen was performed both before and after the administration of intravenous contrast. Heavily T2-weighted images of the biliary and pancreatic ducts were obtained, and three-dimensional MRCP images were rendered by post processing. CONTRAST:  9 cc Gadavist IV. COMPARISON:  05/07/2019 CT abdomen/pelvis. 05/08/2019 abdominal sonogram. FINDINGS: Lower chest: Small dependent bilateral pleural effusions with moderate dependent bibasilar atelectasis. Hepatobiliary: Normal liver size and configuration. Mild diffuse hepatic steatosis. No liver mass. No cholelithiasis. Layering sludge in the gallbladder. Mild diffuse gallbladder wall thickening. No significant pericholecystic fluid. No biliary ductal dilatation. Common bile duct diameter 5 mm. Smooth narrowing of the intrapancreatic portion of the distal common bile duct. No filling defects within the biliary tree to suggest choledocholithiasis. No discrete biliary stricture. No biliary beading or masses. Pancreas: There is mild diffuse pancreatic and peripancreatic edema, compatible with acute pancreatitis. Retroperitoneal edema extends into anterior paranephric spaces bilaterally, left greater than right. Preserved pancreatic  parenchymal enhancement. No pancreatic mass or duct dilation. No pancreas divisum. No peripancreatic fluid collections. Spleen: Normal size. No mass. Adrenals/Urinary Tract: Normal adrenals. No hydronephrosis. Normal kidneys with no renal mass. Stomach/Bowel: Normal non-distended stomach. Visualized small and large bowel is normal caliber, with no bowel wall thickening. Vascular/Lymphatic: Normal caliber abdominal aorta. Patent portal, splenic, hepatic and renal veins. No pathologically enlarged lymph nodes in the abdomen. Other: Trace perihepatic and perisplenic ascites. No focal fluid collections. Musculoskeletal: No aggressive appearing focal osseous lesions. IMPRESSION: 1. Acute uncomplicated non necrotizing pancreatitis. 2. No cholelithiasis. No biliary ductal dilatation. No choledocholithiasis. Smooth extrinsic narrowing of the distal CBD by the inflamed pancreas. 3. Small bilateral pleural effusions with bibasilar atelectasis. 4. Trace perihepatic and perisplenic ascites. Electronically Signed   By: Ilona Sorrel M.D.   On: 05/10/2019 11:20    Anti-infectives: Anti-infectives (From admission, onward)   Start     Dose/Rate Route Frequency Ordered Stop   05/09/19 0930  cefTRIAXone (ROCEPHIN) 2 g in sodium chloride 0.9 % 100 mL IVPB     2 g 200 mL/hr over 30 Minutes Intravenous Every 24 hours 05/09/19 0926         Assessment/Plan HTN HLD DM Leukocytosis - WBC back down to 15K today   Pancreatitis, hyperbilirubinemia, possible contained duodenal perforation, unclear etiology -MRCP reviewed yesterday and shows nonnecrotizing pancreatitis with no evidence to suggest duodenal perforation or gallstones.   -lipase almost normal, LFTs down trending, and WBC almost normal at 11.8 -pain is slightly improved today.  ? Trial of clear liquids.  Will defer to GI on their thoughts if this should be tried yet. -given no gallstones and pancreatitis is nonnecrotizing, no acute surgical intervention  warranted.  We will defer to GI and medicine for medical management of his pancreatitis. -no surgical follow up needed, we will sign off  FEN -NPO/IVFs VTE -Lovenox ID -Rocephin 8/26 -->   LOS: 4 days    Letha Cape , [facility] 05/11/2019, 9:04 AM Pager: [phone]

## 2019-05-11 NOTE — Progress Notes (Signed)
PROGRESS NOTE  Mark Kidd PZW:[id number] DOB: 09-06-1956 DOA: 05/07/2019 PCP: Lorene Dy, MD  Brief History   Mark Kidd  is a 63 y.o. male, with history of diabetes mellitus type 2, hyperlipidemia, hypertension who came to hospital with epigastric pain which started last night.  Patient says that pain was very severe and kind of eased off around 1 AM.  In the morning though he felt better.  As soon as he ate food the pain returned after 1 hour.  He was also sweating and in extreme discomfort due to pain.  Patient says that he had a similar pain around August 10 at that time he was seen by his PCP and was prescribed omeprazole which did not help the pain.  He had one episode of vomiting in the hospital. He denies vomiting any blood. Denies black stool. Denies blood in the stool. Denies previous history of gallstones. Denies chest pain or shortness of breath. Denies fever or chills.  In the ED CT scan of the abdomen pelvis showed marked retroperitoneal inflammatory changes around second and third part of duodenum concerning for perforated peptic ulcer, also surrounding inflammation of pancreas.  Lipase significantly elevated to 6000, calcification noted at ampulla of greater concern for stone.  Both GI, Nandigam  and general surgery Dr Dema Severin were consulted at [facility] by Dr. Lacinda Axon.  They both will see patient as consult when patient reaches [facility].  No urgent surgery recommended as per conversation of Dr. Lacinda Axon and Dr. Dema Severin.  The patient has been admitted to a medical bed. He has continued to have abdominal pain. He has been seen by Dr. Dema Severin and Dr. Oletta Lamas.  On 05/09/2019 the patient had improvement in lipase, but increase in bilirubin and WBC. MRCP demonstrated no biliary obstruction. It does demonstrated that the inflammed pancreas appears to impinge on the CBD resulting in narrowing at the distal end.  Consultants  . General Surgery . Gastroenterology  Procedures  . MRCP   Antibiotics   Anti-infectives (From admission, onward)   Start     Dose/Rate Route Frequency Ordered Stop   05/09/19 0930  cefTRIAXone (ROCEPHIN) 2 g in sodium chloride 0.9 % 100 mL IVPB     2 g 200 mL/hr over 30 Minutes Intravenous Every 24 hours 05/09/19 0926       Subjective  The patient is resting in bed. He states that he is feeling quite a bit better and wants to eat.  Objective   Vitals:  Vitals:   05/11/19 0455 05/11/19 1447  BP: (!) 164/89 (!) 165/90  Pulse: 91 (!) 101  Resp: 18 20  Temp: 98.8 F (37.1 C) 99 F (37.2 C)  SpO2: 94% 97%    Exam:  Constitutional:  . The patient is resting comfortably. Mild distress from abdominal pain. Respiratory:  . No increased work of breathing. . No murmurs, ectopy or gallups. . No lateral PMI. No thrills. Cardiovascular:  . Regular rate and rhythm . No murmurs, ectopy, or gallups . No lateral PMI. No thrills. Abdomen:  . Abdomen is distended and diffusely tender. . No hernias, masses, or organomegaly. Marland Kitchen Positive for tympany with percussion. . Hypooactive bowel sounds.  Musculoskeletal:  . No cyanosis, clubbing, or edema Skin:  . No rashes, lesions, ulcers . palpation of skin: no induration or nodules Neurologic:  . CN 2-12 intact . Sensation all 4 extremities intact Psychiatric:  . Mental status o Mood, affect appropriate o Orientation to person, place, time  . judgment and  insight appear intact    I have personally reviewed the following:   Today's Data  . Vitals, CBC, CMP  Scheduled Meds: . enoxaparin (LOVENOX) injection  40 mg Subcutaneous Q24H  . insulin aspart  0-9 Units Subcutaneous Q4H  . pantoprazole (PROTONIX) IV  40 mg Intravenous Q12H  . sucralfate  1 g Oral TID WC & HS   Continuous Infusions: . sodium chloride 150 mL/hr at 05/11/19 1811  . cefTRIAXone (ROCEPHIN)  IV Stopped (05/11/19 1014)    No problems updated.   LOS: 4 days   A & P  Perforated duodenal ulcer-as per CT scan  abdomen pelvis as above, patient is clinically stable.  Vitals are stable, does not have rigidity or guarding on abdominal examination.  General surgery has been consulted by ED physician, Dr. Dema Severin will see patient when patient arrives at [facility].  We will keep him n.p.o., dilaudid, IV normal saline at 125 mill per hour. General surgery wishes to treat this problem conservatively with close monitoring.  Acute pancreatitis-lipase significantly elevated 6889, AST 514, ALT 378, alk phos 91, total bilirubin 3.1. CT abdomen also shows punctate calcification in the duodenum near the ampulla which could reflect an ampullary stone.  GI has been consulted.  GI has seen the patient. Patient will be kept n.p.o. as above. Pain control and IV fluids as above. Lipase is down to 78's on 05/11/2019. He will be started on a clear liquid diet.  Hyperbilirubinemia: Bilirubin is down to 2.1 from 9.0. WBC down to 11.8. MRCP demonstrated no obstruction with biliary stone, however it appears that the inflammed pancreas may be narrowing the distal end of the duct.  Diabetes mellitus type 2-we will initiate sliding scale insulin with NovoLog. Check CBG every 4 hours.  Metformin on hold.  Hypertension-blood pressure stable, patient takes Coreg 6.25 mg every evening.  Will start metoprolol 2.5 mg IV every 8 hours   I have seen and examined this patient myself. I have spent 42 minutes in his evaluation and care. I have spent more than 50% of time in coordination of care with Dr. Oletta Lamas.  DVT Prophylaxis:  Lovenox  Family Communication: The patient's wife is at bedside. Code Status: Full code Disposition: Home  [name], DO [facility] Direct contact: see [email or web address]  7PM-7AM contact night coverage as above 05/11/2019, 7:22 PM  LOS: 1 day

## 2019-05-12 LAB — TRIGLYCERIDES: Triglycerides: 106 mg/dL (ref <150)

## 2019-05-12 LAB — BASIC METABOLIC PANEL
Anion gap: 9 (ref 5–15)
BUN: 8 mg/dL (ref 8–23)
CO2: 25 mmol/L (ref 22–32)
Calcium: 7.2 mg/dL — ABNORMAL LOW (ref 8.9–10.3)
Chloride: 104 mmol/L (ref 98–111)
Creatinine, Ser: 0.76 mg/dL (ref 0.61–1.24)
GFR calc Af Amer: >60 mL/min (ref >60)
GFR calc non Af Amer: >60 mL/min (ref >60)
Glucose, Bld: 145 mg/dL — ABNORMAL HIGH (ref 70–99)
Potassium: 2.8 mmol/L — ABNORMAL LOW (ref 3.5–5.1)
Sodium: 138 mmol/L (ref 135–145)

## 2019-05-12 LAB — CBC WITH DIFFERENTIAL/PLATELET
Abs Immature Granulocytes: 0.1 10*3/uL — ABNORMAL HIGH (ref 0.00–0.07)
Basophils Absolute: 0 10*3/uL (ref 0.0–0.1)
Basophils Relative: 0 %
Eosinophils Absolute: 0 10*3/uL (ref 0.0–0.5)
Eosinophils Relative: 0 %
HCT: 39.2 % (ref 39.0–52.0)
Hemoglobin: 13.4 g/dL (ref 13.0–17.0)
Immature Granulocytes: 1 %
Lymphocytes Relative: 5 %
Lymphs Abs: 0.6 10*3/uL — ABNORMAL LOW (ref 0.7–4.0)
MCH: 29.9 pg (ref 26.0–34.0)
MCHC: 34.2 g/dL (ref 30.0–36.0)
MCV: 87.5 fL (ref 80.0–100.0)
Monocytes Absolute: 0.8 10*3/uL (ref 0.1–1.0)
Monocytes Relative: 7 %
Neutro Abs: 11 10*3/uL — ABNORMAL HIGH (ref 1.7–7.7)
Neutrophils Relative %: 87 %
Platelets: 154 10*3/uL (ref 150–400)
RBC: 4.48 MIL/uL (ref 4.22–5.81)
RDW: 13.6 % (ref 11.5–15.5)
WBC: 12.5 10*3/uL — ABNORMAL HIGH (ref 4.0–10.5)
nRBC: 0 % (ref 0.0–0.2)

## 2019-05-12 LAB — COMPREHENSIVE METABOLIC PANEL
ALT: 85 U/L — ABNORMAL HIGH (ref 0–44)
AST: 61 U/L — ABNORMAL HIGH (ref 15–41)
Albumin: 2.3 g/dL — ABNORMAL LOW (ref 3.5–5.0)
Alkaline Phosphatase: 87 U/L (ref 38–126)
Anion gap: 9 (ref 5–15)
BUN: 9 mg/dL (ref 8–23)
CO2: 25 mmol/L (ref 22–32)
Calcium: 7.2 mg/dL — ABNORMAL LOW (ref 8.9–10.3)
Chloride: 108 mmol/L (ref 98–111)
Creatinine, Ser: 0.81 mg/dL (ref 0.61–1.24)
GFR calc Af Amer: >60 mL/min (ref >60)
GFR calc non Af Amer: >60 mL/min (ref >60)
Glucose, Bld: 133 mg/dL — ABNORMAL HIGH (ref 70–99)
Potassium: 2.7 mmol/L — CL (ref 3.5–5.1)
Sodium: 142 mmol/L (ref 135–145)
Total Bilirubin: 1.8 mg/dL — ABNORMAL HIGH (ref 0.3–1.2)
Total Protein: 5.5 g/dL — ABNORMAL LOW (ref 6.5–8.1)

## 2019-05-12 LAB — GLUCOSE, CAPILLARY
Glucose-Capillary: 113 mg/dL — ABNORMAL HIGH (ref 70–99)
Glucose-Capillary: 120 mg/dL — ABNORMAL HIGH (ref 70–99)
Glucose-Capillary: 135 mg/dL — ABNORMAL HIGH (ref 70–99)
Glucose-Capillary: 139 mg/dL — ABNORMAL HIGH (ref 70–99)
Glucose-Capillary: 159 mg/dL — ABNORMAL HIGH (ref 70–99)
Glucose-Capillary: 209 mg/dL — ABNORMAL HIGH (ref 70–99)

## 2019-05-12 MED ORDER — POTASSIUM CHLORIDE 10 MEQ/100ML IV SOLN
10.0000 meq | INTRAVENOUS | Status: AC
  Administered 2019-05-12 (×2): 10 meq via INTRAVENOUS
  Filled 2019-05-12: qty 100

## 2019-05-12 MED ORDER — POTASSIUM CHLORIDE CRYS ER 20 MEQ PO TBCR
40.0000 meq | EXTENDED_RELEASE_TABLET | Freq: Once | ORAL | Status: AC
  Administered 2019-05-12: 04:00:00 40 meq via ORAL
  Filled 2019-05-12: qty 2

## 2019-05-12 MED ORDER — TRAZODONE HCL 50 MG PO TABS
50.0000 mg | ORAL_TABLET | Freq: Once | ORAL | Status: AC
  Administered 2019-05-12: 50 mg via ORAL
  Filled 2019-05-12: qty 1

## 2019-05-12 MED ORDER — POTASSIUM CHLORIDE 10 MEQ/100ML IV SOLN
10.0000 meq | INTRAVENOUS | Status: AC
  Administered 2019-05-12 (×4): 10 meq via INTRAVENOUS
  Filled 2019-05-12 (×4): qty 100

## 2019-05-12 MED ORDER — POTASSIUM CHLORIDE CRYS ER 20 MEQ PO TBCR
40.0000 meq | EXTENDED_RELEASE_TABLET | Freq: Once | ORAL | Status: AC
  Administered 2019-05-12: 40 meq via ORAL
  Filled 2019-05-12: qty 2

## 2019-05-12 NOTE — Progress Notes (Signed)
CRITICAL VALUE ALERT  Critical Value:  k2.7  Date & Time Notied:  05/12/2019  Provider Notified: **[facility]  Orders Received/Actions taken:awaiting

## 2019-05-12 NOTE — Progress Notes (Signed)
[facility] Gastroenterology Progress Note  Mark Kidd 63 y.o. 08-15-1956  CC: Pancreatitis   Subjective: Complaining of left upper quadrant abdominal pain.  Abdomen is distended.  Having multiple small bowel movements throughout the day.  Denies any nausea or vomiting.  Tolerating clear liquid diet.  ROS : Negative for acute chest pain and shortness of breath.  Positive for fatigue and weakness   Objective: Vital signs in last 24 hours: Vitals:   05/12/19 0407 05/12/19 0916  BP: (!) 160/87 (!) 164/87  Pulse: 93 76  Resp: 20   Temp: 97.8 F (36.6 C) 99.3 F (37.4 C)  SpO2: 92% 95%    Physical Exam:  General:  Alert, cooperative, no distress, appears stated age  Head:  Normocephalic, without obvious abnormality, atraumatic  Eyes:  , EOM's intact,   Lungs:   Clear to auscultation bilaterally, respirations unlabored  Heart:  Regular rate and rhythm, S1, S2 normal  Abdomen:    Abdomen is distended, bowel sounds present, left upper quadrant tenderness to palpation, no peritoneal signs  Extremities: Extremities normal, atraumatic, no  edema  Pulses: 2+ and symmetric    Lab Results: Recent Labs    05/11/19 0558 05/12/19 0152  NA 143 142  K 3.0* 2.7*  CL 112* 108  CO2 23 25  GLUCOSE 143* 133*  BUN 13 9  CREATININE 0.91 0.81  CALCIUM 7.0* 7.2*   Recent Labs    05/11/19 0558 05/12/19 0152  AST 58* 61*  ALT 103* 85*  ALKPHOS 96 87  BILITOT 2.1* 1.8*  PROT 5.4* 5.5*  ALBUMIN 2.4* 2.3*   Recent Labs    05/11/19 0558 05/12/19 0152  WBC 11.8* 12.5*  NEUTROABS 10.5* 11.0*  HGB 13.9 13.4  HCT 40.6 39.2  MCV 89.0 87.5  PLT 138* 154   No results for input(s): LABPROT, INR in the last 72 hours.    Assessment/Plan: -Acute pancreatitis. ?  Etiology.  No gallstones.  No significant alcohol use.  MRI MRCP May 10, 2019 Showed acute uncomplicated pancreatitis with smooth extrinsic narrowing of the CBD by inflamed pancreas.  ?  Calcification in the duodenum on initial CT  scan it could be impacted stone in the ampulla which may have passed now.  -Abnormal CT scan showing circumferential thickening of the second and third portion of the duodenum most likely reactive from pancreatitis.  Recommendations ------------------------ -LFTs improving. -Advance diet to full liquid -Ambulate with assistance -May benefit from repeating ultrasound or MRI in few weeks once pancreatitis has resolved to evaluate CBD.  He will need follow-up with Dr. Michail Sermon in 2 to 3 weeks after discharge -GI will follow  Otis Brace MD, [place] 05/12/2019, 10:12 AM  Contact #  [phone]

## 2019-05-12 NOTE — Progress Notes (Signed)
PROGRESS NOTE  Mark Kidd JSE:[id number] DOB: [birth year] DOA: 05/07/2019 PCP: Lorene Dy, MD  Brief History   Mark Kidd  is a 63 y.o. male, with history of diabetes mellitus type 2, hyperlipidemia, hypertension who came to hospital with epigastric pain which started last night.  Patient says that pain was very severe and kind of eased off around 1 AM.  In the morning though he felt better.  As soon as he ate food the pain returned after 1 hour.  He was also sweating and in extreme discomfort due to pain.  Patient says that he had a similar pain around August 10 at that time he was seen by his PCP and was prescribed omeprazole which did not help the pain.  He had one episode of vomiting in the hospital. He denies vomiting any blood. Denies black stool. Denies blood in the stool. Denies previous history of gallstones. Denies chest pain or shortness of breath. Denies fever or chills.  In the ED CT scan of the abdomen pelvis showed marked retroperitoneal inflammatory changes around second and third part of duodenum concerning for perforated peptic ulcer, also surrounding inflammation of pancreas.  Lipase significantly elevated to 6000, calcification noted at ampulla of greater concern for stone.  Both GI, Nandigam  and general surgery Dr Dema Severin were consulted at [facility] by Dr. Lacinda Axon.  They both will see patient as consult when patient reaches [facility].  No urgent surgery recommended as per conversation of Dr. Lacinda Axon and Dr. Dema Severin.  The patient has been admitted to a medical bed. He has continued to have abdominal pain. He has been seen by Dr. Dema Severin and Dr. Oletta Lamas.  On 05/09/2019 the patient had improvement in lipase, but increase in bilirubin and WBC. MRCP demonstrated no biliary obstruction. It does demonstrated that the inflammed pancreas appears to impinge on the CBD resulting in narrowing at the distal end.  Consultants  . General Surgery . Gastroenterology  Procedures  . MRCP   Antibiotics   Anti-infectives (From admission, onward)   Start     Dose/Rate Route Frequency Ordered Stop   05/09/19 0930  cefTRIAXone (ROCEPHIN) 2 g in sodium chloride 0.9 % 100 mL IVPB     2 g 200 mL/hr over 30 Minutes Intravenous Every 24 hours 05/09/19 0926       Subjective  The patient is resting in bed. No new complaints.  Objective   Vitals:  Vitals:   05/12/19 1312 05/12/19 1400  BP: (!) 180/96 (!) 168/84  Pulse: 99   Resp: 18   Temp: 98.6 F (37 C)   SpO2: 97%     Exam:  Constitutional:  . The patient is resting comfortably. No acute distress Respiratory:  . No increased work of breathing. . No murmurs, ectopy or gallups. . No lateral PMI. No thrills. Cardiovascular:  . Regular rate and rhythm . No murmurs, ectopy, or gallups . No lateral PMI. No thrills. Abdomen:  . Abdomen is distended and diffusely tender. . No hernias, masses, or organomegaly. Marland Kitchen Positive for tympany with percussion. . Hypooactive bowel sounds.  Musculoskeletal:  . No cyanosis, clubbing, or edema Skin:  . No rashes, lesions, ulcers . palpation of skin: no induration or nodules Neurologic:  . CN 2-12 intact . Sensation all 4 extremities intact Psychiatric:  . Mental status o Mood, affect appropriate o Orientation to person, place, time  . judgment and insight appear intact    I have personally reviewed the following:   Today's Data  .  Vitals, CBC, CMP  Scheduled Meds: . enoxaparin (LOVENOX) injection  40 mg Subcutaneous Q24H  . insulin aspart  0-9 Units Subcutaneous Q4H  . pantoprazole (PROTONIX) IV  40 mg Intravenous Q12H  . sucralfate  1 g Oral TID WC & HS   Continuous Infusions: . sodium chloride 150 mL/hr at 05/12/19 1509  . cefTRIAXone (ROCEPHIN)  IV 2 g (05/12/19 0942)    No problems updated.   LOS: 5 days   A & P  Perforated duodenal ulcer-as per CT scan abdomen pelvis as above, patient is clinically stable.  Vitals are stable, does not have rigidity or  guarding on abdominal examination.  General surgery has been consulted by ED physician, Dr. Dema Severin will see patient when patient arrives at [facility].  We will keep him n.p.o., dilaudid, IV normal saline at 125 mill per hour. General surgery wishes to treat this problem conservatively with close monitoring.  Acute pancreatitis-lipase significantly elevated 6889, AST 514, ALT 378, alk phos 91, total bilirubin 1.8. CT abdomen also shows punctate calcification in the duodenum near the ampulla which could reflect an ampullary stone.  GI has been consulted.  GI has seen the patient.Pain control and IV fluids as above. Lipase is down to 78's on 05/11/2019. He will be advanced to a full liquid diet.  Hyperbilirubinemia: Bilirubin is down to 2.1 from 9.0. WBC down to 11.8. MRCP demonstrated no obstruction with biliary stone, however it appears that the inflammed pancreas may be narrowing the distal end of the duct.  Diabetes mellitus type 2-we will initiate sliding scale insulin with NovoLog. Check CBG every 4 hours.  Metformin on hold.  Hypertension-blood pressure stable, patient takes Coreg 6.25 mg every evening.  Will start metoprolol 2.5 mg IV every 8 hours   I have seen and examined this patient myself. I have spent 32 minutes in his evaluation and care.  DVT Prophylaxis:  Lovenox  Family Communication: The patient's wife is at bedside. Code Status: Full code Disposition: Home  Sylvan Sookdeo, DO [facility] Direct contact: see [email or web address]  7PM-7AM contact night coverage as above 05/12/2019, 4:50 PM  LOS: 1 day

## 2019-05-12 NOTE — Plan of Care (Signed)
  Problem: Nutrition: Goal: Adequate nutrition will be maintained Outcome: Progressing   Problem: Elimination: Goal: Will not experience complications related to bowel motility Outcome: Progressing Goal: Will not experience complications related to urinary retention Outcome: Progressing   Problem: Pain Managment: Goal: General experience of comfort will improve Outcome: Not Progressing   Problem: Safety: Goal: Ability to remain free from injury will improve Outcome: Progressing   Problem: Skin Integrity: Goal: Risk for impaired skin integrity will decrease Outcome: Progressing

## 2019-05-13 LAB — GLUCOSE, CAPILLARY
Glucose-Capillary: 122 mg/dL — ABNORMAL HIGH (ref 70–99)
Glucose-Capillary: 127 mg/dL — ABNORMAL HIGH (ref 70–99)
Glucose-Capillary: 128 mg/dL — ABNORMAL HIGH (ref 70–99)
Glucose-Capillary: 135 mg/dL — ABNORMAL HIGH (ref 70–99)
Glucose-Capillary: 139 mg/dL — ABNORMAL HIGH (ref 70–99)
Glucose-Capillary: 170 mg/dL — ABNORMAL HIGH (ref 70–99)
Glucose-Capillary: 174 mg/dL — ABNORMAL HIGH (ref 70–99)

## 2019-05-13 LAB — COMPREHENSIVE METABOLIC PANEL
ALT: 86 U/L — ABNORMAL HIGH (ref 0–44)
AST: 67 U/L — ABNORMAL HIGH (ref 15–41)
Albumin: 2.4 g/dL — ABNORMAL LOW (ref 3.5–5.0)
Alkaline Phosphatase: 92 U/L (ref 38–126)
Anion gap: 9 (ref 5–15)
BUN: 8 mg/dL (ref 8–23)
CO2: 25 mmol/L (ref 22–32)
Calcium: 7.2 mg/dL — ABNORMAL LOW (ref 8.9–10.3)
Chloride: 103 mmol/L (ref 98–111)
Creatinine, Ser: 0.87 mg/dL (ref 0.61–1.24)
GFR calc Af Amer: >60 mL/min (ref >60)
GFR calc non Af Amer: >60 mL/min (ref >60)
Glucose, Bld: 168 mg/dL — ABNORMAL HIGH (ref 70–99)
Potassium: 2.7 mmol/L — CL (ref 3.5–5.1)
Sodium: 137 mmol/L (ref 135–145)
Total Bilirubin: 1.8 mg/dL — ABNORMAL HIGH (ref 0.3–1.2)
Total Protein: 5.8 g/dL — ABNORMAL LOW (ref 6.5–8.1)

## 2019-05-13 LAB — CBC
HCT: 40.3 % (ref 39.0–52.0)
Hemoglobin: 14.1 g/dL (ref 13.0–17.0)
MCH: 30.5 pg (ref 26.0–34.0)
MCHC: 35 g/dL (ref 30.0–36.0)
MCV: 87 fL (ref 80.0–100.0)
Platelets: 155 10*3/uL (ref 150–400)
RBC: 4.63 MIL/uL (ref 4.22–5.81)
RDW: 13.7 % (ref 11.5–15.5)
WBC: 12.7 10*3/uL — ABNORMAL HIGH (ref 4.0–10.5)
nRBC: 0 % (ref 0.0–0.2)

## 2019-05-13 LAB — MAGNESIUM: Magnesium: 2 mg/dL (ref 1.7–2.4)

## 2019-05-13 MED ORDER — POTASSIUM CHLORIDE 10 MEQ/100ML IV SOLN
10.0000 meq | INTRAVENOUS | Status: AC
  Administered 2019-05-13 (×4): 10 meq via INTRAVENOUS
  Filled 2019-05-13 (×4): qty 100

## 2019-05-13 MED ORDER — POTASSIUM CHLORIDE CRYS ER 20 MEQ PO TBCR
40.0000 meq | EXTENDED_RELEASE_TABLET | ORAL | Status: AC
  Administered 2019-05-13 (×2): 40 meq via ORAL
  Filled 2019-05-13 (×2): qty 2

## 2019-05-13 MED ORDER — SODIUM CHLORIDE 0.9 % IV SOLN
INTRAVENOUS | Status: DC
  Administered 2019-05-13 – 2019-05-18 (×6): via INTRAVENOUS

## 2019-05-13 MED ORDER — POTASSIUM CHLORIDE CRYS ER 20 MEQ PO TBCR
40.0000 meq | EXTENDED_RELEASE_TABLET | Freq: Two times a day (BID) | ORAL | Status: AC
  Administered 2019-05-13 (×2): 40 meq via ORAL
  Filled 2019-05-13 (×2): qty 2

## 2019-05-13 NOTE — Progress Notes (Signed)
[facility] Gastroenterology Progress Note  Mark Kidd 63 y.o. 10-Mar-1956  CC: Pancreatitis   Subjective: Abdominal pain resolved.  Sitting comfortably in the chair.  Denies nausea or vomiting.  Had multiple bowel movements overnight with small amount of formed stool.  Abdomen remains distended.  ROS : Negative for acute chest pain and shortness of breath.     Objective: Vital signs in last 24 hours: Vitals:   05/12/19 2009 05/13/19 0428  BP: (!) 160/93 (!) 167/90  Pulse: 88 95  Resp: 20 20  Temp: 99.9 F (37.7 C) 99.2 F (37.3 C)  SpO2: 95% 97%    Physical Exam:  General:  Alert, cooperative, no distress, appears stated age  Head:  Normocephalic, without obvious abnormality, atraumatic  Eyes:  , EOM's intact,   Lungs:   Clear to auscultation bilaterally, respirations unlabored  Heart:  Regular rate and rhythm, S1, S2 normal  Abdomen:    Abdomen is distended, bowel sounds present, nontender n, no peritoneal signs  Extremities: Extremities normal, atraumatic, no  edema  Pulses: 2+ and symmetric    Lab Results: Recent Labs    05/12/19 1704 05/13/19 0255  NA 138 137  K 2.8* 2.7*  CL 104 103  CO2 25 25  GLUCOSE 145* 168*  BUN 8 8  CREATININE 0.76 0.87  CALCIUM 7.2* 7.2*   Recent Labs    05/12/19 0152 05/13/19 0255  AST 61* 67*  ALT 85* 86*  ALKPHOS 87 92  BILITOT 1.8* 1.8*  PROT 5.5* 5.8*  ALBUMIN 2.3* 2.4*   Recent Labs    05/11/19 0558 05/12/19 0152 05/13/19 0255  WBC 11.8* 12.5* 12.7*  NEUTROABS 10.5* 11.0*  --   HGB 13.9 13.4 14.1  HCT 40.6 39.2 40.3  MCV 89.0 87.5 87.0  PLT 138* 154 155   No results for input(s): LABPROT, INR in the last 72 hours.    Assessment/Plan: -Acute pancreatitis. ?  Etiology.  No gallstones.  No significant alcohol use.  MRI MRCP May 10, 2019 Showed acute uncomplicated pancreatitis with smooth extrinsic narrowing of the CBD by inflamed pancreas.  ?  Calcification in the duodenum on initial CT scan it could be impacted  stone in the ampulla which may have passed now.  -Abnormal CT scan showing circumferential thickening of the second and third portion of the duodenum most likely reactive from pancreatitis.  Recommendations ------------------------ -Abdominal pain resolved.  LFT stable. -Advance diet to soft. -Check GI pathogen panel for multiple bowel movements.  Check abdomen x-ray for abdominal distention.   -May benefit from repeating ultrasound or MRI in few weeks once pancreatitis has resolved to evaluate CBD.  He will need follow-up with Dr. Michail Sermon in 2 to 3 weeks after discharge  -GI will follow  Otis Brace MD, [place] 05/13/2019, 11:30 AM  Contact #  [phone]

## 2019-05-13 NOTE — Progress Notes (Signed)
CRITICAL VALUE ALERT  Critical Value:  Potassium 2.7  Date & Time Notied:  05/13/19, 0430  Provider Notified: X. Blount  Orders Received/Actions taken: awaiting for orders

## 2019-05-13 NOTE — Progress Notes (Signed)
PROGRESS NOTE  Mark Kidd [id number]:[id number] DOB: 08-24-1956 DOA: 05/07/2019 PCP: Lorene Dy, MD  Brief History   Mark Kidd  is a 63 y.o. male, with history of diabetes mellitus type 2, hyperlipidemia, hypertension who came to hospital with epigastric pain which started last night.  Patient says that pain was very severe and kind of eased off around 1 AM.  In the morning though he felt better.  As soon as he ate food the pain returned after 1 hour.  He was also sweating and in extreme discomfort due to pain.  Patient says that he had a similar pain around August 10 at that time he was seen by his PCP and was prescribed omeprazole which did not help the pain.  He had one episode of vomiting in the hospital. He denies vomiting any blood. Denies black stool. Denies blood in the stool. Denies previous history of gallstones. Denies chest pain or shortness of breath. Denies fever or chills.  In the ED CT scan of the abdomen pelvis showed marked retroperitoneal inflammatory changes around second and third part of duodenum concerning for perforated peptic ulcer, also surrounding inflammation of pancreas.  Lipase significantly elevated to 6000, calcification noted at ampulla of greater concern for stone.  Both GI, Nandigam  and general surgery Dr Dema Severin were consulted at [facility] by Dr. Lacinda Axon.  They both will see patient as consult when patient reaches [facility].  No urgent surgery recommended as per conversation of Dr. Lacinda Axon and Dr. Dema Severin.  The patient has been admitted to a medical bed. He has continued to have abdominal pain. He has been seen by Dr. Dema Severin and Dr. Oletta Lamas.  On 05/09/2019 the patient had improvement in lipase, but increase in bilirubin and WBC. MRCP demonstrated no biliary obstruction. It does demonstrated that the inflammed pancreas appears to impinge on the CBD resulting in narrowing at the distal end.  05/13/2019: Patient seen.  Abdominal pain has resolved.  Patient is tolerating oral  feeds.  Hypokalemia persist.  Potassium is 2.7 today.  We will continue to replete.  Patient will be discharged back on once potassium is replete.  Magnesium is 2.  Consultants  . General Surgery . Gastroenterology  Procedures  . MRCP  Antibiotics   Anti-infectives (From admission, onward)   Start     Dose/Rate Route Frequency Ordered Stop   05/09/19 0930  cefTRIAXone (ROCEPHIN) 2 g in sodium chloride 0.9 % 100 mL IVPB     2 g 200 mL/hr over 30 Minutes Intravenous Every 24 hours 05/09/19 0926       Subjective  The patient is resting in bed. No new complaints.  Objective   Vitals:  Vitals:   05/13/19 0428 05/13/19 1310  BP: (!) 167/90 (!) 174/83  Pulse: 95 91  Resp: 20 (!) 21  Temp: 99.2 F (37.3 C) 99.2 F (37.3 C)  SpO2: 97% 98%    Exam:  Constitutional:  . The patient is resting comfortably. No acute distress Respiratory:  Clear to auscultation  cardiovascular:  . S1-S2 Abdomen:  . Abdomen is mildly distended, soft and nontender.  Organs are difficult to assess.   Neurologic:  . Awake and alert.   . Patient was all extremities.     Today's Data  . Vitals, CBC, CMP  Scheduled Meds: . enoxaparin (LOVENOX) injection  40 mg Subcutaneous Q24H  . insulin aspart  0-9 Units Subcutaneous Q4H  . pantoprazole (PROTONIX) IV  40 mg Intravenous Q12H  . potassium chloride  40 mEq Oral BID WC  . potassium chloride  40 mEq Oral Q4H  . sucralfate  1 g Oral TID WC & HS   Continuous Infusions: . sodium chloride 150 mL/hr at 05/13/19 1240  . cefTRIAXone (ROCEPHIN)  IV 200 mL/hr at 05/13/19 0903  . potassium chloride      No problems updated.   LOS: 6 days   A & P  Hypokalemia:  Potassium is 2.7 today. IV KCl 10 M EQ every hour x4 hours K-Dur 40 M EQ p.o. every 4x2 doses Check magnesium level (came back as 2) Telemetry monitoring Decrease IV fluid Monitor renal function and electrolytes.  Questionable perforated duodenal ulcer-as per CT scan abdomen  pelvis as above, patient is clinically stable.  Vitals are stable, does not have rigidity or guarding on abdominal examination.  General surgery has been consulted by ED physician, Dr. Dema Severin will see patient when patient arrives at [facility].  We will keep him n.p.o., dilaudid, IV normal saline at 125 mill per hour. General surgery wishes to treat this problem conservatively with close monitoring. 05/13/2019: Above is questionable   Acute pancreatitis-lipase significantly elevated 6889, AST 514, ALT 378, alk phos 91, total bilirubin 1.8. CT abdomen also shows punctate calcification in the duodenum near the ampulla which could reflect an ampullary stone.  GI has been consulted.  GI has seen the patient.Pain control and IV fluids as above. Lipase is down to 78's on 05/11/2019. He will be advanced to a full liquid diet. 05/13/2019: Symptoms have resolved significantly.  Patient is tolerating oral feeds.  Hyperbilirubinemia: Bilirubin is down to 2.1 from 9.0. WBC down to 11.8. MRCP demonstrated no obstruction with biliary stone, however it appears that the inflammed pancreas may be narrowing the distal end of the duct.  Diabetes mellitus type 2-we will initiate sliding scale insulin with NovoLog. Check CBG every 4 hours.  Metformin on hold.  Hypertension-blood pressure stable, patient takes Coreg 6.25 mg every evening.  Will start metoprolol 2.5 mg IV every 8 hours   DVT Prophylaxis:  Lovenox  Family Communication: The patient's wife is at bedside. Code Status: Full code Disposition: Home  Dana Allan MD [facility] Direct contact: see [email or web address]  7PM-7AM contact night coverage as above

## 2019-05-13 NOTE — Plan of Care (Signed)

## 2019-05-14 ENCOUNTER — Inpatient Hospital Stay (HOSPITAL_COMMUNITY): Payer: 59

## 2019-05-14 DIAGNOSIS — R6510 Systemic inflammatory response syndrome (SIRS) of non-infectious origin without acute organ dysfunction: Secondary | ICD-10-CM

## 2019-05-14 DIAGNOSIS — E876 Hypokalemia: Secondary | ICD-10-CM

## 2019-05-14 LAB — URINALYSIS, ROUTINE W REFLEX MICROSCOPIC
Bacteria, UA: NONE SEEN
Bilirubin Urine: NEGATIVE
Glucose, UA: NEGATIVE mg/dL
Hgb urine dipstick: NEGATIVE
Ketones, ur: NEGATIVE mg/dL
Leukocytes,Ua: NEGATIVE
Nitrite: NEGATIVE
Protein, ur: 100 mg/dL — AB
Specific Gravity, Urine: 1.017 (ref 1.005–1.030)
pH: 6 (ref 5.0–8.0)

## 2019-05-14 LAB — CBC WITH DIFFERENTIAL/PLATELET
Abs Immature Granulocytes: 0.49 10*3/uL — ABNORMAL HIGH (ref 0.00–0.07)
Band Neutrophils: 3 %
Basophils Absolute: 0 10*3/uL (ref 0.0–0.1)
Basophils Absolute: 0 10*3/uL (ref 0.0–0.1)
Basophils Relative: 0 %
Basophils Relative: 0 %
Blasts: 0 %
Eosinophils Absolute: 0 10*3/uL (ref 0.0–0.5)
Eosinophils Absolute: 0 10*3/uL (ref 0.0–0.5)
Eosinophils Relative: 0 %
Eosinophils Relative: 0 %
HCT: 41.4 % (ref 39.0–52.0)
HCT: 40.3 % (ref 39.0–52.0)
Hemoglobin: 14.5 g/dL (ref 13.0–17.0)
Hemoglobin: 14 g/dL (ref 13.0–17.0)
Immature Granulocytes: 3 %
Lymphocytes Relative: 4 %
Lymphocytes Relative: 4 %
Lymphs Abs: 0.7 10*3/uL (ref 0.7–4.0)
Lymphs Abs: 0.7 10*3/uL (ref 0.7–4.0)
MCH: 29.9 pg (ref 26.0–34.0)
MCH: 30.1 pg (ref 26.0–34.0)
MCHC: 35 g/dL (ref 30.0–36.0)
MCHC: 34.7 g/dL (ref 30.0–36.0)
MCV: 85.4 fL (ref 80.0–100.0)
MCV: 86.7 fL (ref 80.0–100.0)
Metamyelocytes Relative: 0 %
Monocytes Absolute: 1.2 10*3/uL — ABNORMAL HIGH (ref 0.1–1.0)
Monocytes Absolute: 1.1 10*3/uL — ABNORMAL HIGH (ref 0.1–1.0)
Monocytes Relative: 7 %
Monocytes Relative: 6 %
Myelocytes: 0 %
Neutro Abs: 14.9 10*3/uL — ABNORMAL HIGH (ref 1.7–7.7)
Neutro Abs: 14.9 10*3/uL — ABNORMAL HIGH (ref 1.7–7.7)
Neutrophils Relative %: 86 %
Neutrophils Relative %: 87 %
Other: 0 %
Platelets: 168 10*3/uL (ref 150–400)
Platelets: 182 10*3/uL (ref 150–400)
Promyelocytes Relative: 0 %
RBC: 4.85 MIL/uL (ref 4.22–5.81)
RBC: 4.65 MIL/uL (ref 4.22–5.81)
RDW: 13.5 % (ref 11.5–15.5)
RDW: 13.8 % (ref 11.5–15.5)
Smear Review: ADEQUATE
WBC Morphology: INCREASED
WBC: 16.8 10*3/uL — ABNORMAL HIGH (ref 4.0–10.5)
WBC: 17.2 10*3/uL — ABNORMAL HIGH (ref 4.0–10.5)
nRBC: 0 % (ref 0.0–0.2)
nRBC: 0 /100 WBC
nRBC: 0 % (ref 0.0–0.2)

## 2019-05-14 LAB — GASTROINTESTINAL PANEL BY PCR, STOOL (REPLACES STOOL CULTURE)

## 2019-05-14 LAB — BLOOD GAS, ARTERIAL
Acid-base deficit: 0.8 mmol/L (ref 0.0–2.0)
Bicarbonate: 21.9 mmol/L (ref 20.0–28.0)
Drawn by: 511851
FIO2: 21
O2 Saturation: 95.4 %
Patient temperature: 99.6
pCO2 arterial: 28.2 mmHg — ABNORMAL LOW (ref 32.0–48.0)
pH, Arterial: 7.505 — ABNORMAL HIGH (ref 7.350–7.450)
pO2, Arterial: 72 mmHg — ABNORMAL LOW (ref 83.0–108.0)

## 2019-05-14 LAB — LACTOFERRIN, FECAL, QUALITATIVE: Lactoferrin, Fecal, Qual: NEGATIVE

## 2019-05-14 LAB — C DIFFICILE QUICK SCREEN W PCR REFLEX
C Diff antigen: NEGATIVE
C Diff interpretation: NOT DETECTED
C Diff toxin: NEGATIVE

## 2019-05-14 LAB — MAGNESIUM
Magnesium: 2 mg/dL (ref 1.7–2.4)
Magnesium: 2 mg/dL (ref 1.7–2.4)

## 2019-05-14 LAB — RENAL FUNCTION PANEL
Albumin: 2.3 g/dL — ABNORMAL LOW (ref 3.5–5.0)
Albumin: 2.1 g/dL — ABNORMAL LOW (ref 3.5–5.0)
Anion gap: 12 (ref 5–15)
Anion gap: 9 (ref 5–15)
BUN: 8 mg/dL (ref 8–23)
BUN: 8 mg/dL (ref 8–23)
CO2: 19 mmol/L — ABNORMAL LOW (ref 22–32)
CO2: 24 mmol/L (ref 22–32)
Calcium: 7.3 mg/dL — ABNORMAL LOW (ref 8.9–10.3)
Calcium: 7.2 mg/dL — ABNORMAL LOW (ref 8.9–10.3)
Chloride: 101 mmol/L (ref 98–111)
Chloride: 100 mmol/L (ref 98–111)
Creatinine, Ser: 0.76 mg/dL (ref 0.61–1.24)
Creatinine, Ser: 0.8 mg/dL (ref 0.61–1.24)
GFR calc Af Amer: >60 mL/min (ref >60)
GFR calc Af Amer: >60 mL/min (ref >60)
GFR calc non Af Amer: >60 mL/min (ref >60)
GFR calc non Af Amer: >60 mL/min (ref >60)
Glucose, Bld: 128 mg/dL — ABNORMAL HIGH (ref 70–99)
Glucose, Bld: 134 mg/dL — ABNORMAL HIGH (ref 70–99)
Phosphorus: 1.4 mg/dL — ABNORMAL LOW (ref 2.5–4.6)
Phosphorus: 1.7 mg/dL — ABNORMAL LOW (ref 2.5–4.6)
Potassium: 3.1 mmol/L — ABNORMAL LOW (ref 3.5–5.1)
Potassium: 3.6 mmol/L (ref 3.5–5.1)
Sodium: 132 mmol/L — ABNORMAL LOW (ref 135–145)
Sodium: 133 mmol/L — ABNORMAL LOW (ref 135–145)

## 2019-05-14 LAB — GLUCOSE, CAPILLARY
Glucose-Capillary: 130 mg/dL — ABNORMAL HIGH (ref 70–99)
Glucose-Capillary: 125 mg/dL — ABNORMAL HIGH (ref 70–99)
Glucose-Capillary: 150 mg/dL — ABNORMAL HIGH (ref 70–99)
Glucose-Capillary: 181 mg/dL — ABNORMAL HIGH (ref 70–99)
Glucose-Capillary: 150 mg/dL — ABNORMAL HIGH (ref 70–99)
Glucose-Capillary: 160 mg/dL — ABNORMAL HIGH (ref 70–99)

## 2019-05-14 LAB — TROPONIN I (HIGH SENSITIVITY)
Troponin I (High Sensitivity): 15 ng/L (ref <18)
Troponin I (High Sensitivity): 61 ng/L — ABNORMAL HIGH (ref <18)
Troponin I (High Sensitivity): 45 ng/L — ABNORMAL HIGH (ref <18)
Troponin I (High Sensitivity): 50 ng/L — ABNORMAL HIGH (ref <18)

## 2019-05-14 LAB — PROCALCITONIN: Procalcitonin: 0.47 ng/mL

## 2019-05-14 LAB — BRAIN NATRIURETIC PEPTIDE: B Natriuretic Peptide: 170.5 pg/mL — ABNORMAL HIGH (ref 0.0–100.0)

## 2019-05-14 LAB — D-DIMER, QUANTITATIVE: D-Dimer, Quant: 15.94 ug/mL-FEU — ABNORMAL HIGH (ref 0.00–0.50)

## 2019-05-14 MED ORDER — SACCHAROMYCES BOULARDII 250 MG PO CAPS
250.0000 mg | ORAL_CAPSULE | Freq: Two times a day (BID) | ORAL | Status: DC
  Administered 2019-05-14 – 2019-05-22 (×17): 250 mg via ORAL
  Filled 2019-05-14 (×17): qty 1

## 2019-05-14 MED ORDER — POTASSIUM CHLORIDE CRYS ER 20 MEQ PO TBCR
40.0000 meq | EXTENDED_RELEASE_TABLET | ORAL | Status: AC
  Administered 2019-05-14 (×2): 40 meq via ORAL
  Filled 2019-05-14 (×2): qty 2

## 2019-05-14 MED ORDER — SODIUM CHLORIDE 0.9 % IV SOLN
1.0000 g | Freq: Three times a day (TID) | INTRAVENOUS | Status: DC
  Administered 2019-05-14 – 2019-05-18 (×12): 1 g via INTRAVENOUS
  Filled 2019-05-14 (×17): qty 1

## 2019-05-14 MED ORDER — PANTOPRAZOLE SODIUM 40 MG PO TBEC
40.0000 mg | DELAYED_RELEASE_TABLET | Freq: Two times a day (BID) | ORAL | Status: DC
  Administered 2019-05-14 – 2019-05-15 (×3): 40 mg via ORAL
  Filled 2019-05-14 (×3): qty 1

## 2019-05-14 MED ORDER — K PHOS MONO-SOD PHOS DI & MONO 155-852-130 MG PO TABS
500.0000 mg | ORAL_TABLET | Freq: Three times a day (TID) | ORAL | Status: AC
  Administered 2019-05-14 – 2019-05-15 (×4): 500 mg via ORAL
  Filled 2019-05-14 (×5): qty 2

## 2019-05-14 MED ORDER — CARVEDILOL 6.25 MG PO TABS
6.2500 mg | ORAL_TABLET | Freq: Two times a day (BID) | ORAL | Status: DC
  Administered 2019-05-14 – 2019-05-22 (×17): 6.25 mg via ORAL
  Filled 2019-05-14 (×19): qty 1

## 2019-05-14 MED ORDER — POTASSIUM PHOSPHATES 15 MMOLE/5ML IV SOLN
30.0000 mmol | Freq: Once | INTRAVENOUS | Status: AC
  Administered 2019-05-14: 30 mmol via INTRAVENOUS
  Filled 2019-05-14: qty 10

## 2019-05-14 NOTE — Significant Event (Signed)
Rapid Response Event Note  Overview: Time Called: 7096 Arrival Time: 1005 Event Type: MEWS  Called by bedside RN for pt with increased MEWS  Initial Focused Assessment: On arrival, pt sitting in chair A&Ox4. No c/o pain, HR 107, BP 161/87, RR 38, spO2 98% on RA. Lung sounds clear/diminished in bases. Pt denies SOB, abd distended and firm, non-tender. BS hyperactive, pt with multiple loose stools throughout the night.   Interventions: KUB done earlier this am Primary Md called prior to my arrival-electrolytes replacement ordered Pt placed on 2L [place] for comfort IV team consulted for additional PIV Dr Marthenia Rolling paged- Coreg, abg, cxr, Labs- Trop, BNP, blood cultures  Plan of Care (if not transferred): Continue to monitor pt status. More frequent vital signs per MEWS Guidelines. Pt does not appear to be in distress, no c/o pain or SOB, recommend watching vitals closely since RR is a change and pt is running low grade temp. Bedside RN notified of plan, and instructed to call surgical team to see pt per Dr Marthenia Rolling.   Will follow up on pt status throughout the day. RN instructed to call with any changes or concerns.  Event Summary:  Outcome: Stayed in room and stabalized   Event ended at [address]

## 2019-05-14 NOTE — Progress Notes (Signed)
PROGRESS NOTE  Mark Kidd [id number]:[id number] DOB: Sep 06, 1956 DOA: 05/07/2019 PCP: Lorene Dy, MD  Brief History   Mark Kidd  is a 63 y.o. male, with history of diabetes mellitus type 2, hyperlipidemia, hypertension who came to hospital with epigastric pain which started last night.  Patient says that pain was very severe and kind of eased off around 1 AM.  In the morning though he felt better.  As soon as he ate food the pain returned after 1 hour.  He was also sweating and in extreme discomfort due to pain.  Patient says that he had a similar pain around August 10 at that time he was seen by his PCP and was prescribed omeprazole which did not help the pain.  He had one episode of vomiting in the hospital. He denies vomiting any blood. Denies black stool. Denies blood in the stool. Denies previous history of gallstones. Denies chest pain or shortness of breath. Denies fever or chills.  In the ED CT scan of the abdomen pelvis showed marked retroperitoneal inflammatory changes around second and third part of duodenum concerning for perforated peptic ulcer, also surrounding inflammation of pancreas.  Lipase significantly elevated to 6000, calcification noted at ampulla of greater concern for stone.  Both GI, Nandigam  and general surgery Dr Dema Severin were consulted at [facility] by Dr. Lacinda Axon.  They both will see patient as consult when patient reaches [facility].  No urgent surgery recommended as per conversation of Dr. Lacinda Axon and Dr. Dema Severin.  The patient has been admitted to a medical bed. He has continued to have abdominal pain. He has been seen by Dr. Dema Severin and Dr. Oletta Lamas.  On 05/09/2019 the patient had improvement in lipase, but increase in bilirubin and WBC. MRCP demonstrated no biliary obstruction. It does demonstrated that the inflammed pancreas appears to impinge on the CBD resulting in narrowing at the distal end.  05/13/2019: Patient seen.  Abdominal pain has resolved.  Patient is tolerating oral  feeds.  Hypokalemia persist.  Potassium is 2.7 today.  We will continue to replete.  Patient will be discharged back on once potassium is replete.  Magnesium is 2. 05/14/2019: Patient has continued to have diarrhea.  Stool analysis (C. difficile, fecal lactoferrin, GI panel) is non revealing.  Mild tachycardia noted.  T-max of 100.5 F noted.  Blood pressure is 150/84 mmHg and tachypnea is noted.  Worsening leukocytosis noted.  We will panculture patient.  Will change IV ceftriaxone to meropenem.  Cardiac enzymes.  Check d-dimer.  ABG result is noted.  Chest x-ray result is noted.  Abdomen x-ray result is noted.  Patient will be transferred to progressive care unit.  GI input is highly appreciated.  Consultants  . General Surgery . Gastroenterology  Procedures  . MRCP  Antibiotics   Anti-infectives (From admission, onward)   Start     Dose/Rate Route Frequency Ordered Stop   05/14/19 1200  meropenem (MERREM) 1 g in sodium chloride 0.9 % 100 mL IVPB     1 g 200 mL/hr over 30 Minutes Intravenous Every 8 hours 05/14/19 1115     05/09/19 0930  cefTRIAXone (ROCEPHIN) 2 g in sodium chloride 0.9 % 100 mL IVPB  Status:  Discontinued     2 g 200 mL/hr over 30 Minutes Intravenous Every 24 hours 05/09/19 0926 05/14/19 1051     Subjective  The patient continues to have diarrhea.   No chest pain.   No shortness of breath reported.   No fever  or chills reported.    Objective   Vitals:  Vitals:   05/14/19 1700 05/14/19 1840  BP: (!) 151/84 (!) 150/84  Pulse: (!) 103 (!) 113  Resp: (!) 31 (!) 32  Temp: (!) 100.5 F (38.1 C) 99.1 F (37.3 C)  SpO2: 98% 99%    Exam:  Constitutional:  . The patient is resting comfortably. No acute distress Respiratory:  Clear to auscultation  cardiovascular:  . S1-S2 Abdomen:  . Abdomen is mildly distended, soft and nontender.  Organs are difficult to assess.   Neurologic:  . Awake and alert.   . Patient was all extremities.     Today's Data  .  Vitals, CBC, CMP  Scheduled Meds: . carvedilol  6.25 mg Oral BID WC  . enoxaparin (LOVENOX) injection  40 mg Subcutaneous Q24H  . insulin aspart  0-9 Units Subcutaneous Q4H  . pantoprazole  40 mg Oral BID AC  . phosphorus  500 mg Oral TID  . saccharomyces boulardii  250 mg Oral BID  . sucralfate  1 g Oral TID WC & HS   Continuous Infusions: . sodium chloride 50 mL/hr at 05/14/19 1500  . meropenem (MERREM) IV Stopped (05/14/19 1234)    No problems updated.   LOS: 7 days   A & P  SIRS: Work-up as documented above. Low threshold to repeat CT scan of the abdomen with contrast. Transfer patient to progressive care unit. Stool work-up is negative Follow blood culture result Follow urinalysis Procalcitonin is 0.47 Low threshold to reconsult the surgical team (if indicated)  hypokalemia:  Potassium is 2.7 today. IV KCl 10 M EQ every hour x4 hours K-Dur 40 M EQ p.o. every 4x2 doses Check magnesium level (came back as 2) Telemetry monitoring Decrease IV fluid Monitor renal function and electrolytes.  05/14/2019: Potassium is 3.1 today.  Continue to replete potassium.  Repeat renal panel after completing potassium supplement.  Questionable perforated duodenal ulcer-as per CT scan abdomen pelvis as above, patient is clinically stable.  Vitals are stable, does not have rigidity or guarding on abdominal examination.  General surgery has been consulted by ED physician, Dr. Dema Severin will see patient when patient arrives at [facility].  We will keep him n.p.o., dilaudid, IV normal saline at 125 mill per hour. General surgery wishes to treat this problem conservatively with close monitoring. 05/13/2019: Above is questionable 05/14/2019: Have a low threshold to repeat CT scan of the abdomen and pelvis.   Acute pancreatitis-lipase significantly elevated 6889, AST 514, ALT 378, alk phos 91, total bilirubin 1.8. CT abdomen also shows punctate calcification in the duodenum near the ampulla which  could reflect an ampullary stone.  GI has been consulted.  GI has seen the patient.Pain control and IV fluids as above. Lipase is down to 78's on 05/11/2019. He will be advanced to a full liquid diet. 05/13/2019: Symptoms have resolved significantly.  Patient is tolerating oral feeds. 05/14/2019: No further abdominal pain reported.  Tolerating p.o.  Hyperbilirubinemia: Bilirubin is down to 2.1 from 9.0. WBC down to 11.8. MRCP demonstrated no obstruction with biliary stone, however it appears that the inflammed pancreas may be narrowing the distal end of the duct. 05/14/2019: Continue to monitor.  Diabetes mellitus type 2-we will initiate sliding scale insulin with NovoLog. Check CBG every 4 hours.  Metformin on hold.  Hypertension: Restart Coreg.   DVT Prophylaxis:  Lovenox  Family Communication: The patient's wife is at bedside. Code Status: Full code Disposition: Home  Dana Allan MD  [facility] Direct contact: see [email or web address]  7PM-7AM contact night coverage as above

## 2019-05-14 NOTE — Progress Notes (Signed)
Pharmacy Antibiotic Note  Mark Kidd is a 63 y.o. male admitted on 05/07/2019 with pancreatitis.  Pharmacy has been consulted for Merrem dosing.   ID: Pancreatitis, questionable perforated duodenal ulcer, worsening leukocytosis . Tmp 100.2>99.6. WBC 16.8. Scr 0.76  Rocephin 2/26>8/31 Merrem 8/31>>  Plan: D/c Rocephin Merrem 1g IV q 8 hrs    Height: 5\' 11"  (180.3 cm) Weight: 190 lb (86.2 kg) IBW/kg (Calculated) : 75.3  Temp (24hrs), Avg:99.5 F (37.5 C), Min:99.1 F (37.3 C), Max:100.2 F (37.9 C)  Recent Labs  Lab 05/10/19 0250 05/11/19 0558 05/12/19 0152 05/12/19 1704 05/13/19 0255 05/14/19 0839  WBC 15.1* 11.8* 12.5*  --  12.7* 16.8*  CREATININE 1.13 0.91 0.81 0.76 0.87 0.76    Estimated Creatinine Clearance: 102 mL/min (by C-G formula based on SCr of 0.76 mg/dL).    No Known Allergies   Aidian Salomon S. Alford Highland, PharmD, BCPS Clinical Staff Pharmacist Eilene Ghazi Stillinger 05/14/2019 11:14 AM

## 2019-05-14 NOTE — Progress Notes (Signed)
Diarr continues--about 5 soft/loose BM's today.  GI pathogen panel pending.  Pt tolerating soft diet.  WBC rising, but pt free of abd pn or n/v.  EXAM:  Pt standing in room in NAD.  No resp distess.  Coherent, appropriate. Abd slt distended and moderately tympanitic. Chest clear, no evident pleural effusion, HR around 90, reg.  LABS:  WBC up to 16.8 from 12.7; LFT's not rechecked today but were stable yest from the day before (transaminases 60-80 range; T Bili 1.8).  Lipase was almost down to nl as of several days ago.  Minimal pedal edema.  CXR from this morning shows only small pleural effusion.  IMPR:  Perhaps leukocytosis reflects a post-pancreatitic inflammatory process, vs C diff.  Given absence of abd pn  RECOMM:  1.    Await Stool study 2.    Will start FloraStor to help with antbx-associated diarr  Cleotis Nipper, M.D. Pager [phone] If no answer or after 5 PM call [phone]

## 2019-05-15 ENCOUNTER — Other Ambulatory Visit (HOSPITAL_COMMUNITY): Payer: 59

## 2019-05-15 ENCOUNTER — Inpatient Hospital Stay (HOSPITAL_COMMUNITY): Payer: 59

## 2019-05-15 DIAGNOSIS — I2694 Multiple subsegmental pulmonary emboli without acute cor pulmonale: Secondary | ICD-10-CM

## 2019-05-15 LAB — LACTIC ACID, PLASMA
Lactic Acid, Venous: 1.2 mmol/L (ref 0.5–1.9)
Lactic Acid, Venous: 1.1 mmol/L (ref 0.5–1.9)

## 2019-05-15 LAB — GLUCOSE, CAPILLARY
Glucose-Capillary: 107 mg/dL — ABNORMAL HIGH (ref 70–99)
Glucose-Capillary: 107 mg/dL — ABNORMAL HIGH (ref 70–99)
Glucose-Capillary: 110 mg/dL — ABNORMAL HIGH (ref 70–99)
Glucose-Capillary: 118 mg/dL — ABNORMAL HIGH (ref 70–99)
Glucose-Capillary: 126 mg/dL — ABNORMAL HIGH (ref 70–99)
Glucose-Capillary: 166 mg/dL — ABNORMAL HIGH (ref 70–99)
Glucose-Capillary: 180 mg/dL — ABNORMAL HIGH (ref 70–99)

## 2019-05-15 LAB — CBC WITH DIFFERENTIAL/PLATELET
Abs Immature Granulocytes: 0.39 10*3/uL — ABNORMAL HIGH (ref 0.00–0.07)
Basophils Absolute: 0.1 10*3/uL (ref 0.0–0.1)
Basophils Relative: 1 %
Eosinophils Absolute: 0 10*3/uL (ref 0.0–0.5)
Eosinophils Relative: 0 %
HCT: 39.1 % (ref 39.0–52.0)
Hemoglobin: 13.6 g/dL (ref 13.0–17.0)
Immature Granulocytes: 2 %
Lymphocytes Relative: 4 %
Lymphs Abs: 0.8 10*3/uL (ref 0.7–4.0)
MCH: 30.2 pg (ref 26.0–34.0)
MCHC: 34.8 g/dL (ref 30.0–36.0)
MCV: 86.9 fL (ref 80.0–100.0)
Monocytes Absolute: 1.4 10*3/uL — ABNORMAL HIGH (ref 0.1–1.0)
Monocytes Relative: 8 %
Neutro Abs: 15.6 10*3/uL — ABNORMAL HIGH (ref 1.7–7.7)
Neutrophils Relative %: 85 %
Platelets: 168 10*3/uL (ref 150–400)
RBC: 4.5 MIL/uL (ref 4.22–5.81)
RDW: 13.7 % (ref 11.5–15.5)
WBC Morphology: INCREASED
WBC: 18.3 10*3/uL — ABNORMAL HIGH (ref 4.0–10.5)
nRBC: 0 % (ref 0.0–0.2)

## 2019-05-15 LAB — RENAL FUNCTION PANEL
Albumin: 2 g/dL — ABNORMAL LOW (ref 3.5–5.0)
Anion gap: 5 (ref 5–15)
BUN: 7 mg/dL — ABNORMAL LOW (ref 8–23)
CO2: 23 mmol/L (ref 22–32)
Calcium: 7.4 mg/dL — ABNORMAL LOW (ref 8.9–10.3)
Chloride: 100 mmol/L (ref 98–111)
Creatinine, Ser: 0.87 mg/dL (ref 0.61–1.24)
GFR calc Af Amer: >60 mL/min (ref >60)
GFR calc non Af Amer: >60 mL/min (ref >60)
Glucose, Bld: 134 mg/dL — ABNORMAL HIGH (ref 70–99)
Phosphorus: 2.4 mg/dL — ABNORMAL LOW (ref 2.5–4.6)
Potassium: 3.3 mmol/L — ABNORMAL LOW (ref 3.5–5.1)
Sodium: 128 mmol/L — ABNORMAL LOW (ref 135–145)

## 2019-05-15 LAB — MAGNESIUM: Magnesium: 2 mg/dL (ref 1.7–2.4)

## 2019-05-15 LAB — LIPASE, BLOOD: Lipase: 25 U/L (ref 11–51)

## 2019-05-15 LAB — HEPARIN LEVEL (UNFRACTIONATED): Heparin Unfractionated: 0.15 IU/mL — ABNORMAL LOW (ref 0.30–0.70)

## 2019-05-15 LAB — MRSA PCR SCREENING: MRSA by PCR: NEGATIVE

## 2019-05-15 MED ORDER — HEPARIN (PORCINE) 25000 UT/250ML-% IV SOLN
1900.0000 [IU]/h | INTRAVENOUS | Status: AC
  Administered 2019-05-15: 1300 [IU]/h via INTRAVENOUS
  Administered 2019-05-16: 1750 [IU]/h via INTRAVENOUS
  Administered 2019-05-17 – 2019-05-18 (×2): 1900 [IU]/h via INTRAVENOUS
  Filled 2019-05-15 (×5): qty 250

## 2019-05-15 MED ORDER — IOHEXOL 300 MG/ML  SOLN
100.0000 mL | Freq: Once | INTRAMUSCULAR | Status: AC | PRN
  Administered 2019-05-15: 100 mL via INTRAVENOUS

## 2019-05-15 MED ORDER — IOHEXOL 350 MG/ML SOLN
75.0000 mL | Freq: Once | INTRAVENOUS | Status: AC | PRN
  Administered 2019-05-15: 13:00:00 75 mL via INTRAVENOUS

## 2019-05-15 MED ORDER — HEPARIN BOLUS VIA INFUSION
2000.0000 [IU] | Freq: Once | INTRAVENOUS | Status: AC
  Administered 2019-05-16: 2000 [IU] via INTRAVENOUS
  Filled 2019-05-15: qty 2000

## 2019-05-15 MED ORDER — PANTOPRAZOLE SODIUM 40 MG PO PACK: 40.0000 mg | PACK | Freq: Two times a day (BID) | ORAL | Status: DC

## 2019-05-15 MED ORDER — HEPARIN BOLUS VIA INFUSION
2000.0000 [IU] | Freq: Once | INTRAVENOUS | Status: AC
  Administered 2019-05-15: 2000 [IU] via INTRAVENOUS
  Filled 2019-05-15: qty 2000

## 2019-05-15 MED ORDER — BISACODYL 10 MG RE SUPP
10.0000 mg | Freq: Once | RECTAL | Status: AC
  Administered 2019-05-15: 10 mg via RECTAL
  Filled 2019-05-15: qty 1

## 2019-05-15 MED ORDER — OMEPRAZOLE 2 MG/ML ORAL SUSPENSION: 40.0000 mg | Freq: Two times a day (BID) | ORAL | Status: DC

## 2019-05-15 MED ORDER — DIPHENHYDRAMINE HCL 25 MG PO CAPS
25.0000 mg | ORAL_CAPSULE | ORAL | Status: DC | PRN
  Administered 2019-05-15: 25 mg via ORAL
  Filled 2019-05-15: qty 1

## 2019-05-15 MED ORDER — SUCRALFATE 1 GM/10ML PO SUSP
1.0000 g | Freq: Four times a day (QID) | ORAL | Status: DC
  Administered 2019-05-15 – 2019-05-22 (×28): 1 g via ORAL
  Filled 2019-05-15 (×26): qty 10

## 2019-05-15 MED ORDER — OMEPRAZOLE 20 MG PO CPDR
40.0000 mg | DELAYED_RELEASE_CAPSULE | Freq: Two times a day (BID) | ORAL | Status: DC
  Administered 2019-05-15 – 2019-05-22 (×15): 40 mg via ORAL
  Filled 2019-05-15 (×17): qty 2

## 2019-05-15 MED ORDER — CYCLOSPORINE 0.05 % OP EMUL
1.0000 [drp] | Freq: Two times a day (BID) | OPHTHALMIC | Status: DC
  Administered 2019-05-15 – 2019-05-22 (×9): 1 [drp] via OPHTHALMIC
  Filled 2019-05-15 (×16): qty 30

## 2019-05-15 MED ORDER — SODIUM CHLORIDE 0.9 % IV BOLUS
500.0000 mL | Freq: Once | INTRAVENOUS | Status: AC
  Administered 2019-05-15: 10:00:00 500 mL via INTRAVENOUS

## 2019-05-15 NOTE — Progress Notes (Signed)
Case discussed with Dr. Benny Lennert.  CT angiography suggests multiple small PEs.  There is concern, radiographically, for a possible ulcer in the region of the pylorus or proximal duodenum.  The patient has been on high-dose PPI therapy and has shown no signs of bleeding, so I think it is reasonable, safe, and appropriate to initiate anticoagulation.    As an added safety measure, I would add sucralfate to the patient's regimen.    I feel that this empiric course of therapy is preferable to doing direct endoscopic inspection of the ulcer, to determine whether it is hemorrhagic in character, because I think the probability of that is quite low, and the risk of doing the procedure in terms of disrupting the ulcer, which is presumably somewhat penetrating in character if it is responsible for his pancreatitis, would outweigh the likely benefit.  I also think it is unlikely that anticoagulation will convert him to hemorrhagic pancreatitis, since he is pain-free, his lipase had essentially normalized as of several days ago, and he is tolerating food well.  For all these reasons, I do not think he has active pancreatitis at this time, but rather, as noted earlier today, simply the resolving postinflammatory changes, so I do not think anticoagulation will lead to pancreatic parenchymal hemorrhage.  Cleotis Nipper, M.D. Pager [phone] If no answer or after 5 PM call [phone]

## 2019-05-15 NOTE — Progress Notes (Signed)
PROGRESS NOTE  Mark Kidd VZD:[id number] DOB: [birth year] DOA: 05/07/2019 PCP: Lorene Dy, MD  Brief History   Mark Kidd  is a 63 y.o. male, with history of diabetes mellitus type 2, hyperlipidemia, hypertension who came to hospital with epigastric pain which started last night.  Patient says that pain was very severe and kind of eased off around 1 AM.  In the morning though he felt better.  As soon as he ate food the pain returned after 1 hour.  He was also sweating and in extreme discomfort due to pain.  Patient says that he had a similar pain around August 10 at that time he was seen by his PCP and was prescribed omeprazole which did not help the pain.  He had one episode of vomiting in the hospital. He denies vomiting any blood. Denies black stool. Denies blood in the stool. Denies previous history of gallstones. Denies chest pain or shortness of breath. Denies fever or chills.  In the ED CT scan of the abdomen pelvis showed marked retroperitoneal inflammatory changes around second and third part of duodenum concerning for perforated peptic ulcer, also surrounding inflammation of pancreas.  Lipase significantly elevated to 6000, calcification noted at ampulla of greater concern for stone.  Both GI, Nandigam  and general surgery Dr Dema Severin were consulted at [facility] by Dr. Lacinda Axon.  They both will see patient as consult when patient reaches [facility].  No urgent surgery recommended as per conversation of Dr. Lacinda Axon and Dr. Dema Severin.  The patient has been admitted to a medical bed. He has continued to have abdominal pain. He has been seen by Dr. Dema Severin and Dr. Oletta Lamas.  On 05/09/2019 the patient had improvement in lipase, but increase in bilirubin and WBC. MRCP demonstrated no biliary obstruction. It does demonstrated that the inflammed pancreas appears to impinge on the CBD resulting in narrowing at the distal end.  On the evening of 05/14/2019 a rapid response was called on the patient due to fever,  tachypnea, and tachycardia.   CT abdomen and pelvix performed on 05/14/2019 demonstrates evolution of the pancreatitis with increased edema and inflammation.  CTA chest on 05/15/2019 was performed due to the patient's ongoing tachypnea, although he has had no hypoxia.  demonstrated small bilateral pulmonary emboli located in the lower lobes and the right middle lobe. I discussed the patient with Dr. Cristina Gong as the CT abdomen and pelvis continued to suggest ulceration in the stomach and duodenum. Dr. Cristina Gong stated that he was comfortably with the initiation of anticoagulation, but recommended the addition of sucralfate.   The patient had another episode of tachypnea and tachycardia in the setting of fevers on 05/15/2019. Blood cultures x 2 have been ordered.  Consultants  . General Surgery . Gastroenterology  Procedures  . MRCP  Antibiotics   Anti-infectives (From admission, onward)   Start     Dose/Rate Route Frequency Ordered Stop   05/14/19 1200  meropenem (MERREM) 1 g in sodium chloride 0.9 % 100 mL IVPB     1 g 200 mL/hr over 30 Minutes Intravenous Every 8 hours 05/14/19 1115     05/09/19 0930  cefTRIAXone (ROCEPHIN) 2 g in sodium chloride 0.9 % 100 mL IVPB  Status:  Discontinued     2 g 200 mL/hr over 30 Minutes Intravenous Every 24 hours 05/09/19 0926 05/14/19 1051     Subjective  The patient is resting in bed. He denies pain, but appears short of breath.  Objective   Vitals:  Vitals:   05/15/19 1320 05/15/19 1605  BP: 139/83   Pulse: 86   Resp: (!) 31   Temp: 98.7 F (37.1 C) 98.6 F (37 C)  SpO2: 97%     Exam:  Constitutional:  . The patient is resting comfortably. He denies pain, but appears short of pain. Respiratory:  . No increased work of breathing. . No murmurs, ectopy or gallups. . No lateral PMI. No thrills. Cardiovascular:  . Regular rate and rhythm . No murmurs, ectopy, or gallups . No lateral PMI. No thrills. Abdomen:  . Abdomen is distended and  diffusely tender. . No hernias, masses, or organomegaly. Marland Kitchen Positive for tympany with percussion. . Tinkling bowel sounds. Musculoskeletal:  . No cyanosis, clubbing, or edema Skin:  . No rashes, lesions, ulcers . palpation of skin: no induration or nodules Neurologic:  . CN 2-12 intact . Sensation all 4 extremities intact Psychiatric:  . Mental status o Mood, affect appropriate o Orientation to person, place, time  . judgment and insight appear intact    I have personally reviewed the following:   Today's Data  . Vitals, CBC, CMP  Scheduled Meds: . carvedilol  6.25 mg Oral BID WC  . cycloSPORINE  1 drop Both Eyes BID  . insulin aspart  0-9 Units Subcutaneous Q4H  . omeprazole  40 mg Oral BID AC  . saccharomyces boulardii  250 mg Oral BID  . sucralfate  1 g Oral Q6H   Continuous Infusions: . sodium chloride 50 mL/hr at 05/14/19 2112  . heparin 1,300 Units/hr (05/15/19 1642)  . meropenem (MERREM) IV 1 g (05/15/19 1433)    No problems updated.   LOS: 8 days   A & P  Perforated duodenal ulcer-as per CT scan abdomen pelvis as above, patient is clinically stable.  Vitals are stable, does not have rigidity or guarding on abdominal examination.  General surgery has been consulted by ED physician, Dr. Dema Severin will see patient when patient arrives at [facility].  We will keep him n.p.o., dilaudid, IV normal saline at 125 mill per hour. General surgery wishes to treat this problem conservatively with close monitoring.  Acute pancreatitis-lipase significantly elevated 6889, AST 514, ALT 378, alk phos 91, total bilirubin 1.8. CT abdomen also shows punctate calcification in the duodenum near the ampulla which could reflect an ampullary stone.  GI has been consulted.  GI has seen the patient.Pain control and IV fluids as above. Lipase is down to 25 on 05/15/2019. He is on a soft diet. Lactic acid is 1.2.  Pulmonary emboli: Pt has been started on a heparin drip. Will transition to [place]  before discharge.  Leukocytosis: Increased. Possibly due to ongoing inflammation in the pancreas or pulmonary embolus.  Hyperbilirubinemia: Bilirubin is down to 2.1 from 9.0. WBC down to 11.8. MRCP demonstrated no obstruction with biliary stone, however it appears that the inflammed pancreas may be narrowing the distal end of the duct.  Diabetes mellitus type 2-we will initiate sliding scale insulin with NovoLog. Check CBG every 4 hours.  Metformin on hold.  Hypertension-blood pressure stable, patient takes Coreg 6.25 mg every evening.  Will start metoprolol 2.5 mg IV every 8 hours   I have seen and examined this patient myself. I have spent 35 minutes in his evaluation and care.  DVT Prophylaxis:  Lovenox  Family Communication: The patient's wife is at bedside. Code Status: Full code Disposition: Home  Pharrell Ledford, DO [facility] Direct contact: see [email or web address]  7PM-7AM contact night  coverage as above 05/15/2019, 6:50 PM  LOS: 1 day

## 2019-05-15 NOTE — Progress Notes (Signed)
Patient overall is doing quite well.  Chief complaint is diarrhea, roughly 5 times over the past 24 hours, although some of that may be a consequence of the oral contrast that he drank for last night's CT scan.  No abdominal pain, no nausea or vomiting, tolerating solid diet well (for example, ate scrambled eggs for breakfast).  He does state that his abdomen is distended and feels "full."  CT last night shows evolving significant pancreatitis, but without overt complications such as necrosis; possible ulcer disease without perforation.  Tmax 101.5  WBC continues to rise gradually despite antbx coverage, from 16.8 to 18.3 over past 24 hrs.  ABG yesterday confirmed some hypoxemia and hyperventilation--pt now on progressive care unit.  Stool studies negative for C diff toxin and antigen, and GI pathogen panel is negative and fecal lactoferrin is negative.  On exam, he is sitting up in the bedside chair in absolutely no distress, oxygen saturation on room air at 96%, no evident increased work of breathing.  Radial pulse is full, skin is warm and well-perfused.  Chest has bibasilar crackles.  Heart normal.  Abdomen mild to moderately distended and somewhat firm with quiet bowel sounds but no evident tenderness.  Trace tibial edema.  No pallor, no icterus.  Impression:  1.  Low-grade fever and leukocytosis suggestive of smoldering infectious or inflammatory process.  I favor inflammatory over infectious, given the absence of response to antibiotic therapy, and documented significant evolution of his pancreatic inflammatory changes.  2.  Diarrhea, most likely antibiotic associated, given negative stool studies   Recommendations:  1.  I think he has evolving post pancreatitic inflammatory changes without active ongoing pancreatitis, so I think it is okay to continue oral nutrition as we are doing, especially given the absence of abdominal symptoms.  I will obtain updated lipase and liver  chemistries tomorrow.  2.  For the diarrhea, would continue observation with probiotic supplementation as we are doing.  If it persists tomorrow, after the Gastrografin has had a chance to get out of his system, I would consider low-dose Imodium but I am trying to avoid that so as to not create an intestinal ileus issue.  Time at bedside was approximately 30 minutes, much of it spent educating the patient about his evolving pancreatitis issues, the reasons for his diarrhea, and his various test results.  Cleotis Nipper, M.D. Pager [phone] If no answer or after 5 PM call [phone]

## 2019-05-15 NOTE — Progress Notes (Signed)
ANTICOAGULATION CONSULT NOTE Pharmacy Consult for Heparin Indication: pulmonary embolus  No Known Allergies  Patient Measurements: Height: 5\' 11"  (180.3 cm) Weight: 190 lb (86.2 kg) IBW/kg (Calculated) : 75.3 Heparin Dosing Weight: 86 kg  Vital Signs: Temp: 98.7 F (37.1 C) (09/01 2015) Temp Source: Oral (09/01 2015) BP: 121/72 (09/01 2015) Pulse Rate: 86 (09/01 1320)  Labs: Recent Labs    05/14/19 0839  05/14/19 1526 05/14/19 1819 05/14/19 1957 05/15/19 0232 05/15/19 2252  HGB 14.5  --   --   --  14.0 13.6  --   HCT 41.4  --   --   --  40.3 39.1  --   PLT 168  --   --   --  182 168  --   HEPARINUNFRC  --   --   --   --   --   --  0.15*  CREATININE 0.76  --   --   --  0.80 0.87  --   TROPONINIHS  --    < > 61* 45* 50*  --   --    < > = values in this interval not displayed.    Estimated Creatinine Clearance: 93.8 mL/min (by C-G formula based on SCr of 0.87 mg/dL).   Assessment: 63 y.o. male with PE for heparin  Goal of Therapy:  Heparin level 0.3-0.7 units/ml Monitor platelets by anticoagulation protocol: Yes   Plan:  Heparin 2000 units IV bolus, then increase heparin 1550 units/hr Follow-up am labs.  Phillis Knack, PharmD, BCPS

## 2019-05-15 NOTE — Progress Notes (Signed)
Pt has not had an acute change this morning, but his vital signs have trended into the RED MEW zone. MD was notified. RN reported that pt does not present short of breath but RR in 30s-40s. Pt's HR slightly above 100. BP staying in 150s/80s. Pt denies pain but reports abdominal pressure, tenderness to RLQ. Pt has already had CT of A/P. Scan showed acute pancreatitis. RN reassessed pt's temp which was 101.3. MD recommended hourly vitals for 4 hours. Along with tylenol for temperature. Pt reports watery eyes, increased congestion since taking protonix, and that this continually happens with this med. Pt given benadryl and restasis drops. Along with simethicone for abdomen. Pt refuses pain meds at this time. Follow up labs ordered including lactic acid. RN will continue to monitor pt.

## 2019-05-15 NOTE — Progress Notes (Signed)
ANTICOAGULATION CONSULT NOTE - Initial Consult  Pharmacy Consult for Heparin Indication: pulmonary embolus  No Known Allergies  Patient Measurements: Height: 5\' 11"  (180.3 cm) Weight: 190 lb (86.2 kg) IBW/kg (Calculated) : 75.3 Heparin Dosing Weight: 86 kg  Vital Signs: Temp: 98.7 F (37.1 C) (09/01 1320) Temp Source: Oral (09/01 1320) BP: 139/83 (09/01 1320) Pulse Rate: 86 (09/01 1320)  Labs: Recent Labs    05/14/19 0839  05/14/19 1526 05/14/19 1819 05/14/19 1957 05/15/19 0232  HGB 14.5  --   --   --  14.0 13.6  HCT 41.4  --   --   --  40.3 39.1  PLT 168  --   --   --  182 168  CREATININE 0.76  --   --   --  0.80 0.87  TROPONINIHS  --    < > 61* 45* 50*  --    < > = values in this interval not displayed.    Estimated Creatinine Clearance: 93.8 mL/min (by C-G formula based on SCr of 0.87 mg/dL).   Medical History: Past Medical History:  Diagnosis Date  . Diabetes mellitus without complication ([place])    pre diabetic   . High cholesterol   . Hypertension     Assessment: 74 YOM who presented on 8/25 with abdominal pain and concern for pancreatitis and possible perforated peptic ulcer. The patient is now noted to have an acute small B/L PE and pharmacy consulted to start Heparin for anticoagulation.  GI okayed start of anticoagulation and notes that there are no current signs of bleeding and probability of hemorrhagic ulcer and/or conversion to a pancreatitic parenchymal hemorrhage is low.   The patient received Lovenox 40 mg for VTE prophylaxis at 0900 this AM - will give a half bolus with Heparin initiation this afternoon due to this. CBC wnl.  Goal of Therapy:  Heparin level 0.3-0.7 units/ml Monitor platelets by anticoagulation protocol: Yes   Plan:  - Heparin 2000 unit bolus x 1 - Start Heparin at a drip rate of 1300 units/hr (13 ml/hr) - Will continue to monitor for any signs/symptoms of bleeding and will follow up with heparin level in 6 hours   Thank  you for allowing pharmacy to be a part of this patient's care.  Alycia Rossetti, PharmD, BCPS Clinical Pharmacist Clinical phone for 05/15/2019: [phone] 05/15/2019 3:21 PM   **Pharmacist phone directory can now be found on [email or web address] ([email or web address]).  Listed under [place].

## 2019-05-16 LAB — GLUCOSE, CAPILLARY
Glucose-Capillary: 131 mg/dL — ABNORMAL HIGH (ref 70–99)
Glucose-Capillary: 142 mg/dL — ABNORMAL HIGH (ref 70–99)
Glucose-Capillary: 148 mg/dL — ABNORMAL HIGH (ref 70–99)
Glucose-Capillary: 153 mg/dL — ABNORMAL HIGH (ref 70–99)
Glucose-Capillary: 188 mg/dL — ABNORMAL HIGH (ref 70–99)
Glucose-Capillary: 192 mg/dL — ABNORMAL HIGH (ref 70–99)

## 2019-05-16 LAB — CBC WITH DIFFERENTIAL/PLATELET
Basophils Absolute: 0.1 10*3/uL (ref 0.0–0.1)
Basophils Relative: 0 %
Eosinophils Absolute: 0.1 10*3/uL (ref 0.0–0.5)
Eosinophils Relative: 0 %
HCT: 38.5 % — ABNORMAL LOW (ref 39.0–52.0)
Hemoglobin: 13.1 g/dL (ref 13.0–17.0)
Lymphocytes Relative: 4 %
Lymphs Abs: 1 10*3/uL (ref 0.7–4.0)
MCH: 30.1 pg (ref 26.0–34.0)
MCHC: 34 g/dL (ref 30.0–36.0)
MCV: 88.5 fL (ref 80.0–100.0)
Monocytes Absolute: 1.4 10*3/uL — ABNORMAL HIGH (ref 0.1–1.0)
Monocytes Relative: 6 %
Neutro Abs: 20.7 10*3/uL — ABNORMAL HIGH (ref 1.7–7.7)
Neutrophils Relative %: 90 %
Platelets: 224 10*3/uL (ref 150–400)
RBC: 4.35 MIL/uL (ref 4.22–5.81)
RDW: 13.9 % (ref 11.5–15.5)
WBC: 23.7 10*3/uL — ABNORMAL HIGH (ref 4.0–10.5)
nRBC: 0 % (ref 0.0–0.2)
nRBC: 0 /100 WBC

## 2019-05-16 LAB — HEPARIN LEVEL (UNFRACTIONATED)
Heparin Unfractionated: 0.24 IU/mL — ABNORMAL LOW (ref 0.30–0.70)
Heparin Unfractionated: 0.41 IU/mL (ref 0.30–0.70)

## 2019-05-16 LAB — COMPREHENSIVE METABOLIC PANEL
ALT: 54 U/L — ABNORMAL HIGH (ref 0–44)
AST: 40 U/L (ref 15–41)
Albumin: 2 g/dL — ABNORMAL LOW (ref 3.5–5.0)
Alkaline Phosphatase: 108 U/L (ref 38–126)
Anion gap: 11 (ref 5–15)
BUN: 8 mg/dL (ref 8–23)
CO2: 27 mmol/L (ref 22–32)
Calcium: 7.3 mg/dL — ABNORMAL LOW (ref 8.9–10.3)
Chloride: 98 mmol/L (ref 98–111)
Creatinine, Ser: 0.98 mg/dL (ref 0.61–1.24)
GFR calc Af Amer: >60 mL/min (ref >60)
GFR calc non Af Amer: >60 mL/min (ref >60)
Glucose, Bld: 103 mg/dL — ABNORMAL HIGH (ref 70–99)
Potassium: 3.1 mmol/L — ABNORMAL LOW (ref 3.5–5.1)
Sodium: 136 mmol/L (ref 135–145)
Total Bilirubin: 0.7 mg/dL (ref 0.3–1.2)
Total Protein: 6 g/dL — ABNORMAL LOW (ref 6.5–8.1)

## 2019-05-16 LAB — URINE CULTURE: Culture: NO GROWTH

## 2019-05-16 LAB — LIPASE, BLOOD: Lipase: 25 U/L (ref 11–51)

## 2019-05-16 MED ORDER — HEPARIN BOLUS VIA INFUSION
2000.0000 [IU] | Freq: Once | INTRAVENOUS | Status: AC
  Administered 2019-05-16: 10:00:00 2000 [IU] via INTRAVENOUS
  Filled 2019-05-16: qty 2000

## 2019-05-16 MED ORDER — HEPARIN BOLUS VIA INFUSION
1250.0000 [IU] | Freq: Once | INTRAVENOUS | Status: DC
  Filled 2019-05-16: qty 1250

## 2019-05-16 NOTE — Progress Notes (Signed)
PROGRESS NOTE  Mark Kidd FIE:[id number] DOB: [birth year] DOA: 05/07/2019 PCP: Lorene Dy, MD  Brief History   Mark Kidd  is a 63 y.o. male, with history of diabetes mellitus type 2, hyperlipidemia, hypertension who came to hospital with epigastric pain which started last night.  Patient says that pain was very severe and kind of eased off around 1 AM.  In the morning though he felt better.  As soon as he ate food the pain returned after 1 hour.  He was also sweating and in extreme discomfort due to pain.  Patient says that he had a similar pain around August 10 at that time he was seen by his PCP and was prescribed omeprazole which did not help the pain.  He had one episode of vomiting in the hospital. He denies vomiting any blood. Denies black stool. Denies blood in the stool. Denies previous history of gallstones. Denies chest pain or shortness of breath. Denies fever or chills.  In the ED CT scan of the abdomen pelvis showed marked retroperitoneal inflammatory changes around second and third part of duodenum concerning for perforated peptic ulcer, also surrounding inflammation of pancreas.  Lipase significantly elevated to 6000, calcification noted at ampulla of greater concern for stone.  Both GI, Nandigam  and general surgery Dr Dema Severin were consulted at [facility] by Dr. Lacinda Axon.  They both will see patient as consult when patient reaches [facility].  No urgent surgery recommended as per conversation of Dr. Lacinda Axon and Dr. Dema Severin.  The patient has been admitted to a medical bed. He has continued to have abdominal pain. He has been seen by Dr. Dema Severin and Dr. Oletta Lamas.  On 05/09/2019 the patient had improvement in lipase, but increase in bilirubin and WBC. MRCP demonstrated no biliary obstruction. It does demonstrated that the inflammed pancreas appears to impinge on the CBD resulting in narrowing at the distal end.  On the evening of 05/14/2019 a rapid response was called on the patient due to fever,  tachypnea, and tachycardia.   CT abdomen and pelvix performed on 05/14/2019 demonstrates evolution of the pancreatitis with increased edema and inflammation.  CTA chest on 05/15/2019 was performed due to the patient's ongoing tachypnea, although he has had no hypoxia.  demonstrated small bilateral pulmonary emboli located in the lower lobes and the right middle lobe. I discussed the patient with Dr. Cristina Gong as the CT abdomen and pelvis continued to suggest ulceration in the stomach and duodenum. Dr. Cristina Gong stated that he was comfortably with the initiation of anticoagulation, but recommended the addition of sucralfate.   The patient had another episode of tachypnea and tachycardia in the setting of fevers on 05/15/2019. Blood cultures x 2 have been ordered. They have had no growth.  Consultants  . General Surgery . Gastroenterology  Procedures  . MRCP  Antibiotics   Anti-infectives (From admission, onward)   Start     Dose/Rate Route Frequency Ordered Stop   05/14/19 1200  meropenem (MERREM) 1 g in sodium chloride 0.9 % 100 mL IVPB     1 g 200 mL/hr over 30 Minutes Intravenous Every 8 hours 05/14/19 1115     05/09/19 0930  cefTRIAXone (ROCEPHIN) 2 g in sodium chloride 0.9 % 100 mL IVPB  Status:  Discontinued     2 g 200 mL/hr over 30 Minutes Intravenous Every 24 hours 05/09/19 0926 05/14/19 1051     Subjective  The patient is resting in bed. The patient does not appear dyspneic today.  Objective  Vitals:  Vitals:   05/16/19 0430 05/16/19 1231  BP: 130/77 129/82  Pulse:    Resp:    Temp: 99.1 F (37.3 C) 98.7 F (37.1 C)  SpO2:      Exam:  Constitutional:  . The patient is resting comfortably. He denies pain, but appears short of pain. Respiratory:  . No increased work of breathing. . No murmurs, ectopy or gallups. . No lateral PMI. No thrills. Cardiovascular:  . Regular rate and rhythm . No murmurs, ectopy, or gallups . No lateral PMI. No thrills. Abdomen:  .  Abdomen is distended and diffusely tender. . No hernias, masses, or organomegaly. Marland Kitchen Positive for tympany with percussion. . Tinkling bowel sounds. Musculoskeletal:  . No cyanosis, clubbing, or edema Skin:  . No rashes, lesions, ulcers . palpation of skin: no induration or nodules Neurologic:  . CN 2-12 intact . Sensation all 4 extremities intact Psychiatric:  . Mental status o Mood, affect appropriate o Orientation to person, place, time  . judgment and insight appear intact    I have personally reviewed the following:   Today's Data  . Vitals, CBC, CMP  Scheduled Meds: . carvedilol  6.25 mg Oral BID WC  . cycloSPORINE  1 drop Both Eyes BID  . insulin aspart  0-9 Units Subcutaneous Q4H  . omeprazole  40 mg Oral BID AC  . saccharomyces boulardii  250 mg Oral BID  . sucralfate  1 g Oral Q6H   Continuous Infusions: . sodium chloride 50 mL/hr at 05/16/19 1513  . heparin 1,750 Units/hr (05/16/19 0950)  . meropenem (MERREM) IV 1 g (05/16/19 1214)    No problems updated.   LOS: 9 days   A & P  Perforated duodenal ulcer-as per CT scan abdomen pelvis as above, patient is clinically stable.  Vitals are stable, does not have rigidity or guarding on abdominal examination.  General surgery has been consulted by ED physician, Dr. Dema Severin will see patient when patient [facility] at [facility].  We will keep him n.p.o., dilaudid, IV normal saline at 125 mill per hour. General surgery wishes to treat this problem conservatively with close monitoring.  Acute pancreatitis-lipase significantly elevated 6889, AST 514, ALT 378, alk phos 91, total bilirubin 1.8. CT abdomen also shows punctate calcification in the duodenum near the ampulla which could reflect an ampullary stone.  GI has been consulted.  GI has seen the patient.Pain control and IV fluids as above. Lipase is down to 25 on 05/15/2019. He is on a soft diet. Lactic acid is 1.2.  Pulmonary emboli: Pt has been started on a heparin drip.  Will transition to [place] before discharge.  Leukocytosis: Increased. Possibly due to ongoing inflammation in the pancreas or pulmonary embolus.  Hyperbilirubinemia: Bilirubin is down to 2.1 from 9.0. WBC down to 11.8. MRCP demonstrated no obstruction with biliary stone, however it appears that the inflammed pancreas may be narrowing the distal end of the duct.  Diabetes mellitus type 2-we will initiate sliding scale insulin with NovoLog. Check CBG every 4 hours.  Metformin on hold.  Hypertension-blood pressure stable, patient takes Coreg 6.25 mg every evening.  Will start metoprolol 2.5 mg IV every 8 hours   I have seen and examined this patient myself. I have spent 36 minutes in his evaluation and care.  DVT Prophylaxis:  Lovenox  Family Communication: The patient's wife is at bedside. Code Status: Full code Disposition: Home  Teyona Nichelson, DO [facility] Direct contact: see [email or web address]  7PM-7AM contact  night coverage as above 05/16/2019, 7:38 PM  LOS: 1 day

## 2019-05-16 NOTE — TOC Benefit Eligibility Note (Signed)
Transition of Care [facility]) Benefit Eligibility Note    Patient Details  Name: Mark Kidd MRN: [id number] Date of Birth: 1956/09/02   Medication/Dose: ELIQUIS  5 MG BID  AND  XARELTO 15 MG BID  Covered?: Yes  Tier: 2 Drug  Prescription Coverage Preferred Pharmacy: [facility] with Person/Company/Phone Number:: [facility] @ [place], RX # [phone]  Co-Pay: $35.00  Prior Approval: No  Deductible: Unmet       Memory Argue Phone Number: 05/16/2019, 4:38 PM

## 2019-05-16 NOTE — Progress Notes (Addendum)
Patient now on heparin because of pulmonary emboli, but so far without any evidence of bleeding from possible peptic ulcer disease noted on CT scan; BUN and hemoglobin remained essentially unchanged from yesterday.  Diarrhea has improved substantially.  He is still having fairly frequent, small squirts but it is not as bad as yesterday.  He has not seen any blood in his bowel movements.  Lipase level is normal at 25, stable from yesterday.  No abdominal pain, food intake well-tolerated and, per patient, is increasing in amount.   White count remains severely elevated at 23,700.  However, no fever since yesterday morning.  Impression:  1.  Quiescent pancreatitis, with significant post pancreatitis inflammatory changes.  No abdominal pain or food intolerance at present.  Lipase normal. 2.  Diarrhea, probably antibiotic associated, improving on probiotic supplementation. 3.  On heparin infusion for pulmonary emboli, without evidence of GI bleeding (questionable ulcer disease on CT, as a possible cause of his pancreatitis at time of admission).  On high-dose PPI therapy (omeprazole, possible adverse reaction to pantoprazole in house).  Recommendations: No change in regimen needed from GI standpoint  1.  Continue current diet 2.  Continue probiotic therapy; would continue probiotics for approximately 2 weeks following cessation of antibiotic therapy. 3.  Continue current high-dose PPI therapy for 1 month, then would consider stopping. 4.  Patient will need follow-up with GI office after discharge, primarily to consider eventual endoscopic evaluation to make sure that there is not lingering ulcer disease in the stomach 5.  It is difficult to know what to do with the patient's antibiotics.  I do not see a clear GI indication for him being on them, but with a high white count, and a low-grade fever yesterday, it is hard to stop them.  I will defer to the hospitalist this matter.  Cleotis Nipper,  M.D. Pager [phone] If no answer or after 5 PM call [phone]

## 2019-05-16 NOTE — Progress Notes (Addendum)
ANTICOAGULATION CONSULT NOTE:  Follow Up Pharmacy Consult for Heparin Indication: pulmonary embolus  No Known Allergies  Patient Measurements: Height: 5\' 11"  (180.3 cm) Weight: 190 lb (86.2 kg) IBW/kg (Calculated) : 75.3 Heparin Dosing Weight: 86 kg  Vital Signs: Temp: 98.7 F (37.1 C) (09/02 1231) Temp Source: Oral (09/02 1231) BP: 129/82 (09/02 1231)  Labs: Recent Labs    05/14/19 1526 05/14/19 1819 05/14/19 1957 05/15/19 0232 05/15/19 2252 05/16/19 0725 05/16/19 1640  HGB  --   --  14.0 13.6  --  13.1  --   HCT  --   --  40.3 39.1  --  38.5*  --   PLT  --   --  182 168  --  224  --   HEPARINUNFRC  --   --   --   --  0.15* 0.24* 0.41  CREATININE  --   --  0.80 0.87  --  0.98  --   TROPONINIHS 61* 45* 50*  --   --   --   --     Estimated Creatinine Clearance: 83.2 mL/min (by C-G formula based on SCr of 0.98 mg/dL).   Assessment: 63 y.o. male with PE for heparin. Heparin level was subtherapeutic at 07:25 this AM (0.24 units/ml); pt rec'd heparin 2000 unit bolus, and heparin infusion rate was increased to 1750 units/hr. Heparin level drawn ~7 hrs after infusion increased to 1750 units/hr was 0.41 units/ml, which is within the goal range for this patient. No issues with infusion or bleeding noted per RN. H/H, platelets stable.  Goal of Therapy:  Heparin level 0.3-0.7 units/ml Monitor platelets by anticoagulation protocol: Yes   Plan:  Continue heparin infusion at 1750 units/hr Heparin level in 6 hours Daily Heparin level, CBC Monitor for signs/symptoms of bleeding  Gillermina Hu, PharmD, BCPS, [place] Please utilize Amion for appropriate phone number to reach the unit pharmacist ([place])

## 2019-05-16 NOTE — Progress Notes (Addendum)
ANTICOAGULATION CONSULT NOTE Pharmacy Consult for Heparin Indication: pulmonary embolus  No Known Allergies  Patient Measurements: Height: 5\' 11"  (180.3 cm) Weight: 190 lb (86.2 kg) IBW/kg (Calculated) : 75.3 Heparin Dosing Weight: 86 kg  Vital Signs: Temp: 99.1 F (37.3 C) (09/02 0430) Temp Source: Oral (09/02 0430) BP: 130/77 (09/02 0430)  Labs: Recent Labs    05/14/19 0839  05/14/19 1526 05/14/19 1819 05/14/19 1957 05/15/19 0232 05/15/19 2252 05/16/19 0725  HGB 14.5  --   --   --  14.0 13.6  --  13.1  HCT 41.4  --   --   --  40.3 39.1  --  38.5*  PLT 168  --   --   --  182 168  --  224  HEPARINUNFRC  --   --   --   --   --   --  0.15* 0.24*  CREATININE 0.76  --   --   --  0.80 0.87  --   --   TROPONINIHS  --    < > 61* 45* 50*  --   --   --    < > = values in this interval not displayed.    Estimated Creatinine Clearance: 93.8 mL/min (by C-G formula based on SCr of 0.87 mg/dL).   Assessment: 63 y.o. male with PE for heparin. AM update: Heparin level subtherapeutic at 0.24, will rebolus and increase rate. No issues with infusion or bleeding noted per RN.   Goal of Therapy:  Heparin level 0.3-0.7 units/ml Monitor platelets by anticoagulation protocol: Yes   Plan:  Heparin 2000 units IV bolus, then increase heparin 1750 units/hr Heparin level in 6 hours Daily Heparin level, CBC.  Eylin Pontarelli A. Levada Dy, PharmD, BCPS, FNKF Clinical Pharmacist [place] Please utilize Amion for appropriate phone number to reach the unit pharmacist ([place])

## 2019-05-17 DIAGNOSIS — R509 Fever, unspecified: Secondary | ICD-10-CM

## 2019-05-17 DIAGNOSIS — Z86711 Personal history of pulmonary embolism: Secondary | ICD-10-CM

## 2019-05-17 DIAGNOSIS — Z8719 Personal history of other diseases of the digestive system: Secondary | ICD-10-CM

## 2019-05-17 DIAGNOSIS — K859 Acute pancreatitis without necrosis or infection, unspecified: Principal | ICD-10-CM

## 2019-05-17 DIAGNOSIS — E119 Type 2 diabetes mellitus without complications: Secondary | ICD-10-CM

## 2019-05-17 DIAGNOSIS — D72829 Elevated white blood cell count, unspecified: Secondary | ICD-10-CM

## 2019-05-17 LAB — GLUCOSE, CAPILLARY
Glucose-Capillary: 119 mg/dL — ABNORMAL HIGH (ref 70–99)
Glucose-Capillary: 121 mg/dL — ABNORMAL HIGH (ref 70–99)
Glucose-Capillary: 128 mg/dL — ABNORMAL HIGH (ref 70–99)
Glucose-Capillary: 145 mg/dL — ABNORMAL HIGH (ref 70–99)
Glucose-Capillary: 150 mg/dL — ABNORMAL HIGH (ref 70–99)
Glucose-Capillary: 153 mg/dL — ABNORMAL HIGH (ref 70–99)

## 2019-05-17 LAB — CBC
HCT: 33.7 % — ABNORMAL LOW (ref 39.0–52.0)
Hemoglobin: 11.6 g/dL — ABNORMAL LOW (ref 13.0–17.0)
MCH: 30.3 pg (ref 26.0–34.0)
MCHC: 34.4 g/dL (ref 30.0–36.0)
MCV: 88 fL (ref 80.0–100.0)
Platelets: 155 10*3/uL (ref 150–400)
RBC: 3.83 MIL/uL — ABNORMAL LOW (ref 4.22–5.81)
RDW: 13.8 % (ref 11.5–15.5)
WBC: 20.7 10*3/uL — ABNORMAL HIGH (ref 4.0–10.5)
nRBC: 0 % (ref 0.0–0.2)

## 2019-05-17 LAB — OCCULT BLOOD X 1 CARD TO LAB, STOOL: Fecal Occult Bld: NEGATIVE

## 2019-05-17 LAB — PROCALCITONIN: Procalcitonin: 0.33 ng/mL

## 2019-05-17 LAB — HEPARIN LEVEL (UNFRACTIONATED)
Heparin Unfractionated: 0.29 IU/mL — ABNORMAL LOW (ref 0.30–0.70)
Heparin Unfractionated: 0.37 IU/mL (ref 0.30–0.70)

## 2019-05-17 MED ORDER — POTASSIUM CHLORIDE 10 MEQ/100ML IV SOLN
10.0000 meq | INTRAVENOUS | Status: AC
  Administered 2019-05-17 – 2019-05-18 (×4): 10 meq via INTRAVENOUS
  Filled 2019-05-17 (×3): qty 100

## 2019-05-17 NOTE — Progress Notes (Signed)
Stable from yesterday.    No dyspnea.    Diarrhea improving, had a couple of semi-formed stools last night without evident blood, although I do note a 2 g drop in hemoglobin over the past 24 hours.  No abdominal pain, continues to tolerate solid diet.  Persistent leukocytosis noted.  Per discussion with patient, apparently infectious disease has seen the patient and suggested cessation of antibiotics, with subsequent observation.  So far, I do not see a note to that effect on the chart, but it would seem reasonable, given the absence of a documented infectious source.  We will monitor hemoglobin and request a stool for occult blood.  Cleotis Nipper, M.D. Pager [phone] If no answer or after 5 PM call [phone]

## 2019-05-17 NOTE — Progress Notes (Signed)
Transitions of Care Pharmacist Note  [name] is a 63 y.o. male that has been diagnosed with a PE and is planned to receive Eliquis (apixaban) at discharge.   Patient Education: I provided the following education on oral anticoagulation to the patient: How to take the medication Described what the medication is Signs of bleeding Signs/symptoms of VTE and stroke  Answered their questions  Discharge Medications Plan: The patient wants to have their discharge medications filled by the Transitions of Care pharmacy rather than their usual pharmacy.  The primary doctor for this patient has been contacted to send all discharge medication prescriptions to the Transitions of Care pharmacy, the discharge orders pharmacy has been changed to the Transitions of Care pharmacy, the patient will receive a phone call regarding co-pay, and their medications will be delivered by the Transitions of Care pharmacy.   Insurance information: N/A   Thank you,   Eddie Candle, PharmD PGY-1 Pharmacy Resident   May 17, 2019

## 2019-05-17 NOTE — Progress Notes (Addendum)
PROGRESS NOTE  Mark Kidd BDZ:[id number] DOB: 02-13-1956 DOA: 05/07/2019 PCP: Lorene Dy, MD  Brief History   Mark Kidd  is a 63 y.o. male, with history of diabetes mellitus type 2, hyperlipidemia, hypertension who came to hospital with epigastric pain which started last night.  Patient says that pain was very severe and kind of eased off around 1 AM.  In the morning though he felt better.  As soon as he ate food the pain returned after 1 hour.  He was also sweating and in extreme discomfort due to pain.  Patient says that he had a similar pain around August 10 at that time he was seen by his PCP and was prescribed omeprazole which did not help the pain.  He had one episode of vomiting in the hospital. He denies vomiting any blood. Denies black stool. Denies blood in the stool. Denies previous history of gallstones. Denies chest pain or shortness of breath. Denies fever or chills.  In the ED CT scan of the abdomen pelvis showed marked retroperitoneal inflammatory changes around second and third part of duodenum concerning for perforated peptic ulcer, also surrounding inflammation of pancreas.  Lipase significantly elevated to 6000, calcification noted at ampulla of greater concern for stone.  Both GI, Nandigam  and general surgery Dr Dema Severin were consulted at [facility] by Dr. Lacinda Axon.  They both will see patient as consult when patient reaches [facility].  No urgent surgery recommended as per conversation of Dr. Lacinda Axon and Dr. Dema Severin.  The patient has been admitted to a medical bed. He has continued to have abdominal pain. He has been seen by Dr. Dema Severin and Dr. Oletta Lamas.  On 05/09/2019 the patient had improvement in lipase, but increase in bilirubin and WBC. MRCP demonstrated no biliary obstruction. It does demonstrated that the inflammed pancreas appears to impinge on the CBD resulting in narrowing at the distal end.  On the evening of 05/14/2019 a rapid response was called on the patient due to fever,  tachypnea, and tachycardia.   CT abdomen and pelvix performed on 05/14/2019 demonstrates evolution of the pancreatitis with increased edema and inflammation.  CTA chest on 05/15/2019 was performed due to the patient's ongoing tachypnea, although he has had no hypoxia.  demonstrated small bilateral pulmonary emboli located in the lower lobes and the right middle lobe. I discussed the patient with Dr. Cristina Gong as the CT abdomen and pelvis continued to suggest ulceration in the stomach and duodenum. Dr. Cristina Gong stated that he was comfortably with the initiation of anticoagulation, but recommended the addition of sucralfate.   The patient had another episode of tachypnea and tachycardia in the setting of fevers on 05/15/2019. Blood cultures x 2 have been ordered. They have had no growth. The patient has continued to have intermittent fevers and WBC remains 20K. I have consulted infectious disease to help determine if there is a reason for concern or continued antibiotics.  Consultants  . General Surgery . Gastroenterology  Procedures  . MRCP  Antibiotics   Anti-infectives (From admission, onward)   Start     Dose/Rate Route Frequency Ordered Stop   05/14/19 1200  meropenem (MERREM) 1 g in sodium chloride 0.9 % 100 mL IVPB     1 g 200 mL/hr over 30 Minutes Intravenous Every 8 hours 05/14/19 1115     05/09/19 0930  cefTRIAXone (ROCEPHIN) 2 g in sodium chloride 0.9 % 100 mL IVPB  Status:  Discontinued     2 g 200 mL/hr over 30  Minutes Intravenous Every 24 hours 05/09/19 0926 05/14/19 1051     Subjective  The patient is resting in bed. The patient does not appear dyspneic today.  Objective   Vitals:  Vitals:   05/17/19 1700 05/17/19 1837  BP:    Pulse: 99 95  Resp: (!) 31 (!) 24  Temp:    SpO2: 94% 96%    Exam:  Constitutional:  . The patient is resting comfortably. No acute distress. Respiratory:  . No increased work of breathing. . No murmurs, ectopy or gallups. . No lateral PMI.  No thrills. Cardiovascular:  . Regular rate and rhythm . No murmurs, ectopy, or gallups . No lateral PMI. No thrills. Abdomen:  . Abdomen is distended and diffusely tender. . No hernias, masses, or organomegaly. Marland Kitchen Positive for tympany with percussion. . Tinkling bowel sounds. Musculoskeletal:  . No cyanosis, clubbing, or edema Skin:  . No rashes, lesions, ulcers . palpation of skin: no induration or nodules Neurologic:  . CN 2-12 intact . Sensation all 4 extremities intact Psychiatric:  . Mental status o Mood, affect appropriate o Orientation to person, place, time  . judgment and insight appear intact    I have personally reviewed the following:   Today's Data  . Vitals, CBC, CMP  Scheduled Meds: . carvedilol  6.25 mg Oral BID WC  . cycloSPORINE  1 drop Both Eyes BID  . insulin aspart  0-9 Units Subcutaneous Q4H  . omeprazole  40 mg Oral BID AC  . saccharomyces boulardii  250 mg Oral BID  . sucralfate  1 g Oral Q6H   Continuous Infusions: . sodium chloride 50 mL/hr at 05/17/19 1401  . heparin 1,900 Units/hr (05/17/19 1402)  . meropenem (MERREM) IV 1 g (05/17/19 1208)  . potassium chloride 10 mEq (05/17/19 1830)    No problems updated.   LOS: 10 days   A & P  Perforated duodenal ulcer-as per CT scan abdomen pelvis as above, patient is clinically stable.  Vitals are stable, does not have rigidity or guarding on abdominal examination.  General surgery has been consulted by ED physician, Dr. Dema Severin will see patient when [facility].  We will keep him n.p.o., dilaudid, IV normal saline at 125 mill per hour. General surgery wishes to treat this problem conservatively with close monitoring.  Acute pancreatitis-lipase significantly elevated 6889, AST 514, ALT 378, alk phos 91, total bilirubin 1.8. CT abdomen also shows punctate calcification in the duodenum near the ampulla which could reflect an ampullary stone.  GI has been consulted.  GI has seen the  patient.Pain control and IV fluids as above. Lipase is down to 25 on 05/15/2019. He is on a soft diet. Lactic acid is 1.2.  Pulmonary emboli: Pt has been started on a heparin drip. Will transition to [place] before discharge.  Leukocytosis: Increased. Possibly due to ongoing inflammation in the pancreas or pulmonary embolus. The patient had another episode of tachypnea and tachycardia in the setting of fevers on 05/15/2019. Blood cultures x 2 have been ordered. They have had no growth. The patient has continued to have intermittent fevers and WBC remains 20K. I have consulted infectious disease to help determine if there is a reason for concern or continued antibiotics.  Hyperbilirubinemia: Bilirubin is down to 2.1 from 9.0. WBC down to 11.8. MRCP demonstrated no obstruction with biliary stone, however it appears that the inflammed pancreas may be narrowing the distal end of the duct.  Diabetes mellitus type 2-we will  initiate sliding scale insulin with NovoLog. Check CBG every 4 hours.  Metformin on hold.  Hypertension-blood pressure stable, patient takes Coreg 6.25 mg every evening.    I have seen and examined this patient myself. I have spent 36 minutes in his evaluation and care.  DVT Prophylaxis:  Lovenox  Family Communication: The patient's wife is at bedside. Code Status: Full code Disposition: Home  Tensley Wery, DO [facility] Direct contact: see [email or web address]  7PM-7AM contact night coverage as above 05/17/2019, 7:03 PM  LOS: 1 day

## 2019-05-17 NOTE — Consult Note (Signed)
[facility]       Reason for Consult: leukocytosis & intermittent fever    Referring Physician: Fran Lowes, DO  Principal Problem:   Pancreatitis Active Problems:   Duodenal ulcer   DM II (diabetes mellitus, type II), controlled (HCC)   Essential hypertension   . carvedilol  6.25 mg Oral BID WC  . cycloSPORINE  1 drop Both Eyes BID  . insulin aspart  0-9 Units Subcutaneous Q4H  . omeprazole  40 mg Oral BID AC  . saccharomyces boulardii  250 mg Oral BID  . sucralfate  1 g Oral Q6H    Recommendations: 1. Fever -I suspect that this is more reactive due to a sterile event with his recent small bilateral pulmonary emboli.  His fever curve has improved since starting empiric anticoagulation with heparin drip.  Blood cultures obtained previously have been unrevealing, but would repeat blood cultures x2 today to further evaluate as he is at a slight for seeding of GI flora to his blood.  On multiple repeat images I see no evidence of focal infection and therefore no clear indication for antibiotics.  While risk does exist for him to progressed to necrotizing pancreatitis both of his most recent abdominal images with MRCP and repeat CT scan do not show this is a complicating factor.  For this reason, we will hold the patient's meropenem and recommend observation.  If the patient is otherwise stable for discharge, his blood cultures can be followed as an outpatient.  The patient expressed understanding that in the event that his blood cultures became positive that it may require readmission to the hospital.  It would also be reasonable to consider obtaining a transthoracic echocardiogram given the patient's recent small bilateral PEs to rule out a central process such as endocarditis.  2. Leukocytosis -patient's white blood cell count has continued to wax and wane throughout his hospitalization.  Early in his hospitalization his white blood cell count was as low as  12,500.  With continued conservative management and now a new diagnosis of bilateral pulmonary emboli patient's white blood cell count has now risen and stabilized in the low 20,000 range.  He has had several blood cultures obtained thus far which have been unrevealing.  Similarly, both MRCP and repeat CT scan have shown no evidence of a duodenal perforation as was initially the concern, nor has he progressed to develop necrotizing pancreatitis or pancreatic pseudocysts.  A right upper quadrant ultrasound also showed no evidence of cholelithiasis or concern for active cholangitis.  We will continue to trend the patient's CBCs with differential daily to trend his white blood cell count.  Again, I see no need for broad-spectrum antibiotics at this time and will DC and observe.  Leukocytosis is not an uncommon event with either advanced pancreatitis or new pulmonary emboli.  Send stool for C. difficile colitis as be he has been on broad-spectrum antibiotics for several days now, although I suspect his watery diarrhea is more due to his recent acute pancreatitis.  Assessment: Patient is a 63 year old Asian male diabetic admitted with severe acute pancreatitis and now persistent leukocytosis with relapsing fever and recent pulmonary emboli.  Antibiotics: Total ABX days 9 meropenem, day 4 + rocephin x 5 days  HPI: Mark Kidd is a 63 y.o. Asian male diabetic who was admitted on May 07, 2019 with severe abdominal pain.  In work-up of his abdominal pain, CT imaging was performed and concerning for a perforated viscus.  GI work-up also noted severe pancreatitis with an initial lipase greater than 6000 from a presumed biliary source.  Per prior imaging the patient has a known history of gallbladder polyps confirmed several years prior.  Throughout his hospital course thus far, he has been conservatively managed with supportive care and bowel rest.  Follow-up MRCP confirmed acute pancreatitis without abscesses or  pseudocyst and showed no evidence of necrotizing pancreatitis.  Repeat contrasted CT scan on May 15, 2019 showed no evidence of abscess or other pancreatic complications aside from mild perinephric stranding (which I suspect is reactive due to his pancreatitis).  He was empirically placed on Rocephin initially for 5 days and then transitioned to meropenem 4 days ago.  His white blood cell count has continued to wax and wane but is stabilized mostly in the 20,000 range.  2 days ago the patient had a relapsing fever, so blood cultures were obtained as well as a CTA of the chest which confirmed new multiple small pulmonary emboli bilaterally.  The patient is now been started on a heparin drip.  Curiously, the patient denies any chest pain and admits to only mild shortness of breath several days ago which is now improved with anticoagulation.  Regarding his bowel habits, he states that he initially had profuse watery diarrhea with nearly 10 BM daily until approximately 3 to 4 days ago which has now decreased to approximately 3 bowel movements daily.  His abdomen remains tense and bloated but he denies any focal abdominal pain at the present time.  His most recent lipase has improved to 25. Fever curves, white blood cell count trends, imaging, and antibiotic usage all independently reviewed.  Review of Systems:  Review of Systems  Constitutional: Positive for fever. Negative for chills and weight loss.  HENT: Negative for congestion, hearing loss, sinus pain and sore throat.   Eyes: Negative for blurred vision, photophobia and discharge.  Respiratory: Positive for shortness of breath. Negative for cough and hemoptysis.   Cardiovascular: Negative for chest pain, palpitations, orthopnea and leg swelling.  Gastrointestinal: Positive for abdominal pain and diarrhea. Negative for constipation, heartburn, nausea and vomiting.  Genitourinary: Negative for dysuria, flank pain, frequency and urgency.   Musculoskeletal: Negative for back pain, joint pain and myalgias.  Skin: Negative for itching and rash.  Neurological: Negative for tremors, seizures, weakness and headaches.  Endo/Heme/Allergies: Negative for polydipsia. Does not bruise/bleed easily.  Psychiatric/Behavioral: Negative for depression and substance abuse. The patient is not nervous/anxious and does not have insomnia.      All other systems reviewed and are negative    Past Medical History:  Diagnosis Date  . Diabetes mellitus without complication (HCC)    pre diabetic   . High cholesterol   . Hypertension   nephrolithiasis  Social History   Tobacco Use  . Smoking status: Never Smoker  . Smokeless tobacco: Never Used  Substance Use Topics  . Alcohol use: Yes    Comment: every other day drinks beer   . Drug use: Never    No family history on file.   Current Facility-Administered Medications:  .  0.9 %  sodium chloride infusion, , Intravenous, Continuous, Berton Mount I, MD, Last Rate: 50 mL/hr at 05/17/19 1401 .  acetaminophen (TYLENOL) tablet 650 mg, 650 mg, Oral, Q6H PRN, 650 mg at 05/15/19 0959 **OR** acetaminophen (TYLENOL) suppository 650 mg, 650 mg, Rectal, Q6H PRN, Sharl Ma, Sarina Ill, MD .  carvedilol (COREG) tablet 6.25 mg, 6.25 mg, Oral, BID WC, Berton Mount  I, MD, 6.25 mg at 05/17/19 1708 .  cycloSPORINE (RESTASIS) 0.05 % ophthalmic emulsion 1 drop, 1 drop, Both Eyes, BID, Swayze, Ava, DO, 1 drop at 05/17/19 0837 .  diphenhydrAMINE (BENADRYL) capsule 25 mg, 25 mg, Oral, Q4H PRN, Swayze, Ava, DO, 25 mg at 05/15/19 0959 .  heparin ADULT infusion 100 units/mL (25000 units/258mL sodium chloride 0.45%), 1,900 Units/hr, Intravenous, Continuous, Swayze, Ava, DO, Last Rate: 19 mL/hr at 05/17/19 1402, 1,900 Units/hr at 05/17/19 1402 .  HYDROmorphone (DILAUDID) injection 1 mg, 1 mg, Intravenous, Q4H PRN, Swayze, Ava, DO, 1 mg at 05/12/19 0151 .  insulin aspart (novoLOG) injection 0-9 Units, 0-9 Units,  Subcutaneous, Q4H, Oswald Hillock, MD, 1 Units at 05/17/19 1709 .  meropenem (MERREM) 1 g in sodium chloride 0.9 % 100 mL IVPB, 1 g, Intravenous, Q8H, Karren Cobble, RPH, Last Rate: 200 mL/hr at 05/17/19 1208, 1 g at 05/17/19 1208 .  omeprazole (PRILOSEC) capsule 40 mg, 40 mg, Oral, BID AC, Swayze, Ava, DO, 40 mg at 05/17/19 0837 .  ondansetron (ZOFRAN) tablet 4 mg, 4 mg, Oral, Q6H PRN **OR** ondansetron (ZOFRAN) injection 4 mg, 4 mg, Intravenous, Q6H PRN, Darrick Meigs, Marge Duncans, MD .  potassium chloride 10 mEq in 100 mL IVPB, 10 mEq, Intravenous, Q1 Hr x 4, Swayze, Ava, DO, Last Rate: 100 mL/hr at 05/17/19 1712, 10 mEq at 05/17/19 1712 .  saccharomyces boulardii (FLORASTOR) capsule 250 mg, 250 mg, Oral, BID, Buccini, Robert, MD, 250 mg at 05/17/19 0837 .  sucralfate (CARAFATE) 1 GM/10ML suspension 1 g, 1 g, Oral, Q6H, Swayze, Ava, DO, 1 g at 05/17/19 1708  No Known Allergies  Vitals:   05/17/19 1144 05/17/19 1213  BP:    Pulse:    Resp:  (!) 26  Temp: 99.2 F (37.3 C)   SpO2:       Physical Exam Gen: pleasant, mild distress secondary to abdominal bloating/pain, A&Ox 3 Head: NCAT, no temporal wasting evident EENT: PERRL, EOMI, MMM, adequate dentition, no scleral icterus Neck: supple, no JVD CV: NRRR, no murmurs evident Pulm: CTA bilaterally, no wheeze or retractions Abd: soft, diffusely distended and tense throughout, mild LUQ TTP, +BS (hyperactive) Extrems:  2+ pitting LE edema, 1+ pulses Skin: no rashes, adequate skin turgor Neuro: CN II-XII grossly intact, no focal neurologic deficits appreciated, gait was not assessed, A&Ox 3   Lab Results  Component Value Date   WBC 20.7 (H) 05/17/2019   HGB 11.6 (L) 05/17/2019   HCT 33.7 (L) 05/17/2019   MCV 88.0 05/17/2019   PLT 155 05/17/2019    Lab Results  Component Value Date   CREATININE 0.98 05/16/2019   BUN 8 05/16/2019   NA 136 05/16/2019   K 3.1 (L) 05/16/2019   CL 98 05/16/2019   CO2 27 05/16/2019    Lab Results   Component Value Date   ALT 54 (H) 05/16/2019   AST 40 05/16/2019   ALKPHOS 108 05/16/2019     Microbiology: Recent Results (from the past 240 hour(s))  SARS Coronavirus 2 [facility] order, Performed in [facility] hospital lab) Nasopharyngeal Nasopharyngeal Swab     Status: None   Collection Time: 05/07/19  9:33 PM   Specimen: Nasopharyngeal Swab  Result Value Ref Range Status   SARS Coronavirus 2 NEGATIVE NEGATIVE Final    Comment: (NOTE) If result is NEGATIVE SARS-CoV-2 target nucleic acids are NOT DETECTED. The SARS-CoV-2 RNA is generally detectable in upper and lower  respiratory specimens during the acute phase of infection. The  lowest  concentration of SARS-CoV-2 viral copies this assay can detect is 250  copies / mL. A negative result does not preclude SARS-CoV-2 infection  and should not be used as the sole basis for treatment or other  patient management decisions.  A negative result may occur with  improper specimen collection / handling, submission of specimen other  than nasopharyngeal swab, presence of viral mutation(s) within the  areas targeted by this assay, and inadequate number of viral copies  (<250 copies / mL). A negative result must be combined with clinical  observations, patient history, and epidemiological information. If result is POSITIVE SARS-CoV-2 target nucleic acids are DETECTED. The SARS-CoV-2 RNA is generally detectable in upper and lower  respiratory specimens dur ing the acute phase of infection.  Positive  results are indicative of active infection with SARS-CoV-2.  Clinical  correlation with patient history and other diagnostic information is  necessary to determine patient infection status.  Positive results do  not rule out bacterial infection or co-infection with other viruses. If result is PRESUMPTIVE POSTIVE SARS-CoV-2 nucleic acids MAY BE PRESENT.   A presumptive positive result was obtained on the submitted specimen  and confirmed on  repeat testing.  While 2019 novel coronavirus  (SARS-CoV-2) nucleic acids may be present in the submitted sample  additional confirmatory testing may be necessary for epidemiological  and / or clinical management purposes  to differentiate between  SARS-CoV-2 and other Sarbecovirus currently known to infect humans.  If clinically indicated additional testing with an alternate test  methodology [phone]([id number]) is advised. The SARS-CoV-2 RNA is generally  detectable in upper and lower respiratory sp ecimens during the acute  phase of infection. The expected result is Negative. Fact Sheet for Patients:  BoilerBrush.com.cyhttps://www.fda.gov/media/136312/download Fact Sheet for Healthcare Providers: https://pope.com/https://www.fda.gov/media/136313/download This test is not yet approved or cleared by the [place] FDA and has been authorized for detection and/or diagnosis of SARS-CoV-2 by FDA under an Emergency Use Authorization (EUA).  This EUA will remain in effect (meaning this test can be used) for the duration of the COVID-19 declaration under Section 564(b)(1) of the Act, 21 U.S.C. section 360bbb-3(b)(1), unless the authorization is terminated or revoked sooner. Performed at [facility], [address] [place], [place] [address]   Gastrointestinal Panel by PCR , Stool     Status: None   Collection Time: 05/14/19 10:33 AM   Specimen: Stool  Result Value Ref Range Status   Campylobacter species NOT DETECTED NOT DETECTED Final   Plesimonas shigelloides NOT DETECTED NOT DETECTED Final   Salmonella species NOT DETECTED NOT DETECTED Final   Yersinia enterocolitica NOT DETECTED NOT DETECTED Final   Vibrio species NOT DETECTED NOT DETECTED Final   Vibrio cholerae NOT DETECTED NOT DETECTED Final   Enteroaggregative E coli (EAEC) NOT DETECTED NOT DETECTED Final   Enteropathogenic E coli (EPEC) NOT DETECTED NOT DETECTED Final   Enterotoxigenic E coli (ETEC) NOT DETECTED NOT DETECTED Final   Shiga like toxin producing E coli  (STEC) NOT DETECTED NOT DETECTED Final   Shigella/Enteroinvasive E coli (EIEC) NOT DETECTED NOT DETECTED Final   Cryptosporidium NOT DETECTED NOT DETECTED Final   Cyclospora cayetanensis NOT DETECTED NOT DETECTED Final   Entamoeba histolytica NOT DETECTED NOT DETECTED Final   Giardia lamblia NOT DETECTED NOT DETECTED Final   Adenovirus F40/41 NOT DETECTED NOT DETECTED Final   Astrovirus NOT DETECTED NOT DETECTED Final   Norovirus GI/GII NOT DETECTED NOT DETECTED Final   Rotavirus A NOT DETECTED NOT DETECTED Final  Sapovirus (I, II, IV, and V) NOT DETECTED NOT DETECTED Final    Comment: Performed at [facility], [address] [place], [place] [address]  C difficile quick scan w PCR reflex     Status: None   Collection Time: 05/14/19 10:33 AM   Specimen: Stool  Result Value Ref Range Status   C Diff antigen NEGATIVE NEGATIVE Final   C Diff toxin NEGATIVE NEGATIVE Final   C Diff interpretation No C. difficile detected.  Final    Comment: Performed at [facility], [address] [place], [place] [address]  Culture, blood (routine x 2)     Status: None (Preliminary result)   Collection Time: 05/14/19 11:07 AM   Specimen: BLOOD  Result Value Ref Range Status   Specimen Description BLOOD SITE NOT SPECIFIED  Final   Special Requests   Final    BOTTLES DRAWN AEROBIC AND ANAEROBIC Blood Culture adequate volume   Culture   Final    NO GROWTH 3 DAYS Performed at [facility], [address] [place], [place] [address]    Report Status PENDING  Incomplete  Culture, blood (routine x 2)     Status: None (Preliminary result)   Collection Time: 05/14/19 12:37 PM   Specimen: BLOOD  Result Value Ref Range Status   Specimen Description BLOOD RIGHT ANTECUBITAL  Final   Special Requests   Final    BOTTLES DRAWN AEROBIC ONLY Blood Culture adequate volume   Culture   Final    NO GROWTH 3 DAYS Performed at [facility], [address] [place], [place] [address]     Report Status PENDING  Incomplete  MRSA PCR Screening     Status: None   Collection Time: 05/14/19 10:49 PM   Specimen: Nasal Mucosa; Nasopharyngeal  Result Value Ref Range Status   MRSA by PCR NEGATIVE NEGATIVE Final    Comment:        The GeneXpert MRSA Assay (FDA approved for NASAL specimens only), is one component of a comprehensive MRSA colonization surveillance program. It is not intended to diagnose MRSA infection nor to guide or monitor treatment for MRSA infections. Performed at [facility], [address] [place], [place] [address]   Culture, Urine     Status: None   Collection Time: 05/14/19 11:26 PM   Specimen: Urine, Clean Catch  Result Value Ref Range Status   Specimen Description URINE, CLEAN CATCH  Final   Special Requests NONE  Final   Culture   Final    NO GROWTH Performed at [facility], [address] [place], [place] [address]    Report Status 05/16/2019 FINAL  Final    [place] LionsJames N Jackline Castilla, MD [facility] [email or web address] 05/17/2019, 5:48 PM

## 2019-05-17 NOTE — Progress Notes (Addendum)
SWOT nurse reported seeing some drops of blood in the pt stool this morning. Pt stated he believes it is his hemorrhoids see small amounts of blood when he wipes. Pharmacy notified

## 2019-05-17 NOTE — Progress Notes (Signed)
ANTICOAGULATION CONSULT NOTE:  Follow Up Pharmacy Consult for Heparin Indication: pulmonary embolus  No Known Allergies  Patient Measurements: Height: 5\' 11"  (180.3 cm) Weight: 190 lb (86.2 kg) IBW/kg (Calculated) : 75.3 Heparin Dosing Weight: 86 kg  Vital Signs: Temp: 99.8 F (37.7 C) (09/03 0226) Temp Source: Oral (09/03 0226) BP: 151/80 (09/03 0028) Pulse Rate: 95 (09/03 0028)  Labs: Recent Labs    05/14/19 1526 05/14/19 1819 05/14/19 1957 05/15/19 0232  05/16/19 0725 05/16/19 1640 05/17/19 0229  HGB  --   --  14.0 13.6  --  13.1  --  11.6*  HCT  --   --  40.3 39.1  --  38.5*  --  33.7*  PLT  --   --  182 168  --  224  --  155  HEPARINUNFRC  --   --   --   --    < > 0.24* 0.41 0.29*  CREATININE  --   --  0.80 0.87  --  0.98  --   --   TROPONINIHS 61* 45* 50*  --   --   --   --   --    < > = values in this interval not displayed.    Estimated Creatinine Clearance: 83.2 mL/min (by C-G formula based on SCr of 0.98 mg/dL).   Assessment: 63 y.o. male with PE for heparin. Heparin level this am 0.29 units/ml.  No issues noted with infusion.  No bleeding reported Goal of Therapy:  Heparin level 0.3-0.7 units/ml Monitor platelets by anticoagulation protocol: Yes   Plan:  Increase heparin to 1900 units/hr Heparin level in 6 hours Daily Heparin level, CBC Monitor for signs/symptoms of bleeding  Thanks for allowing pharmacy to be a part of this patient's care.  Excell Seltzer, PharmD Clinical Pharmacist

## 2019-05-17 NOTE — Progress Notes (Signed)
ANTICOAGULATION CONSULT NOTE - Follow Up Consult  Pharmacy Consult for Heparin Indication: pulmonary embolus  No Known Allergies  Patient Measurements: Height: 5\' 11"  (180.3 cm) Weight: 190 lb (86.2 kg) IBW/kg (Calculated) : 75.3 Heparin Dosing Weight: 86 kg  Vital Signs: Temp: 99.2 F (37.3 C) (09/03 1144) Temp Source: Oral (09/03 1144) BP: 143/76 (09/03 1143) Pulse Rate: 95 (09/03 1143)  Labs: Recent Labs    05/14/19 1526 05/14/19 1819  05/14/19 1957 05/15/19 0232  05/16/19 0725 05/16/19 1640 05/17/19 0229 05/17/19 1034  HGB  --   --    < > 14.0 13.6  --  13.1  --  11.6*  --   HCT  --   --    < > 40.3 39.1  --  38.5*  --  33.7*  --   PLT  --   --    < > 182 168  --  224  --  155  --   HEPARINUNFRC  --   --   --   --   --    < > 0.24* 0.41 0.29* 0.37  CREATININE  --   --   --  0.80 0.87  --  0.98  --   --   --   TROPONINIHS 61* 45*  --  50*  --   --   --   --   --   --    < > = values in this interval not displayed.    Estimated Creatinine Clearance: 83.2 mL/min (by C-G formula based on SCr of 0.98 mg/dL).  Assessment:  82 YOM who presented on 8/25 with abdominal pain and concern for pancreatitis and possible perforated peptic ulcer. The patient found have an acute small B/L PE per CTA on 9/1 and pharmacy consulted to start Heparin for anticoagulation.  GI okayed start of anticoagulation and noted that there were no current signs of bleeding and probability of hemorrhagic ulcer and/or conversion to a pancreatitic parenchymal hemorrhage is low.     RN reported some drops of blood in stool and blood on tissue early am today. Patient reports that this has happened occasionally as outpatient as well.  Thought due to hemorrhoids.      Heparin level is now therapeutic (0.37) on 1900 units/hr after increase from 1750 units/hr ~4:30am when heparin level was just below goal (0.29).  Goal of Therapy:  Heparin level 0.3-0.7 units/ml Monitor platelets by anticoagulation  protocol: Yes   Plan:   Continue heparin drip at 1900 units/hr.  Daily heparin level and CBC.  Monitor for any further blood on tissue, s/sx bleeding.  Noted plan for NOAC prior to discharge.    Arty Baumgartner, [place] Pager: [phone] or phone: [phone] 05/17/2019,12:36 PM

## 2019-05-18 ENCOUNTER — Inpatient Hospital Stay (HOSPITAL_COMMUNITY): Payer: 59

## 2019-05-18 DIAGNOSIS — R509 Fever, unspecified: Secondary | ICD-10-CM

## 2019-05-18 LAB — BASIC METABOLIC PANEL
Anion gap: 9 (ref 5–15)
BUN: 5 mg/dL — ABNORMAL LOW (ref 8–23)
CO2: 27 mmol/L (ref 22–32)
Calcium: 7.2 mg/dL — ABNORMAL LOW (ref 8.9–10.3)
Chloride: 99 mmol/L (ref 98–111)
Creatinine, Ser: 0.85 mg/dL (ref 0.61–1.24)
GFR calc Af Amer: >60 mL/min (ref >60)
GFR calc non Af Amer: >60 mL/min (ref >60)
Glucose, Bld: 166 mg/dL — ABNORMAL HIGH (ref 70–99)
Potassium: 2.8 mmol/L — ABNORMAL LOW (ref 3.5–5.1)
Sodium: 135 mmol/L (ref 135–145)

## 2019-05-18 LAB — CBC WITH DIFFERENTIAL/PLATELET
Abs Immature Granulocytes: 0.38 10*3/uL — ABNORMAL HIGH (ref 0.00–0.07)
Basophils Absolute: 0.1 10*3/uL (ref 0.0–0.1)
Basophils Relative: 0 %
Eosinophils Absolute: 0 10*3/uL (ref 0.0–0.5)
Eosinophils Relative: 0 %
HCT: 33.6 % — ABNORMAL LOW (ref 39.0–52.0)
Hemoglobin: 11.6 g/dL — ABNORMAL LOW (ref 13.0–17.0)
Immature Granulocytes: 2 %
Lymphocytes Relative: 5 %
Lymphs Abs: 0.9 10*3/uL (ref 0.7–4.0)
MCH: 30.2 pg (ref 26.0–34.0)
MCHC: 34.5 g/dL (ref 30.0–36.0)
MCV: 87.5 fL (ref 80.0–100.0)
Monocytes Absolute: 1.4 10*3/uL — ABNORMAL HIGH (ref 0.1–1.0)
Monocytes Relative: 7 %
Neutro Abs: 16.9 10*3/uL — ABNORMAL HIGH (ref 1.7–7.7)
Neutrophils Relative %: 86 %
Platelets: 190 10*3/uL (ref 150–400)
RBC: 3.84 MIL/uL — ABNORMAL LOW (ref 4.22–5.81)
RDW: 13.7 % (ref 11.5–15.5)
WBC: 19.6 10*3/uL — ABNORMAL HIGH (ref 4.0–10.5)
nRBC: 0 % (ref 0.0–0.2)

## 2019-05-18 LAB — COMPREHENSIVE METABOLIC PANEL
ALT: 52 U/L — ABNORMAL HIGH (ref 0–44)
AST: 59 U/L — ABNORMAL HIGH (ref 15–41)
Albumin: 1.7 g/dL — ABNORMAL LOW (ref 3.5–5.0)
Alkaline Phosphatase: 107 U/L (ref 38–126)
Anion gap: 8 (ref 5–15)
BUN: 6 mg/dL — ABNORMAL LOW (ref 8–23)
CO2: 24 mmol/L (ref 22–32)
Calcium: 7 mg/dL — ABNORMAL LOW (ref 8.9–10.3)
Chloride: 103 mmol/L (ref 98–111)
Creatinine, Ser: 0.78 mg/dL (ref 0.61–1.24)
GFR calc Af Amer: >60 mL/min (ref >60)
GFR calc non Af Amer: >60 mL/min (ref >60)
Glucose, Bld: 187 mg/dL — ABNORMAL HIGH (ref 70–99)
Potassium: 2.8 mmol/L — ABNORMAL LOW (ref 3.5–5.1)
Sodium: 135 mmol/L (ref 135–145)
Total Bilirubin: 1.1 mg/dL (ref 0.3–1.2)
Total Protein: 5 g/dL — ABNORMAL LOW (ref 6.5–8.1)

## 2019-05-18 LAB — O&P RESULT

## 2019-05-18 LAB — GLUCOSE, CAPILLARY
Glucose-Capillary: 133 mg/dL — ABNORMAL HIGH (ref 70–99)
Glucose-Capillary: 142 mg/dL — ABNORMAL HIGH (ref 70–99)
Glucose-Capillary: 151 mg/dL — ABNORMAL HIGH (ref 70–99)
Glucose-Capillary: 158 mg/dL — ABNORMAL HIGH (ref 70–99)
Glucose-Capillary: 163 mg/dL — ABNORMAL HIGH (ref 70–99)

## 2019-05-18 LAB — ECHOCARDIOGRAM COMPLETE
Height: 71 in
Weight: 3040 oz

## 2019-05-18 LAB — HEPARIN LEVEL (UNFRACTIONATED)
Heparin Unfractionated: 0.29 IU/mL — ABNORMAL LOW (ref 0.30–0.70)
Heparin Unfractionated: 0.31 IU/mL (ref 0.30–0.70)

## 2019-05-18 LAB — PROCALCITONIN: Procalcitonin: 0.36 ng/mL

## 2019-05-18 LAB — MAGNESIUM: Magnesium: 2 mg/dL (ref 1.7–2.4)

## 2019-05-18 LAB — OVA + PARASITE EXAM

## 2019-05-18 MED ORDER — POTASSIUM CHLORIDE 10 MEQ/100ML IV SOLN
10.0000 meq | INTRAVENOUS | Status: AC
  Administered 2019-05-18 – 2019-05-19 (×4): 10 meq via INTRAVENOUS
  Filled 2019-05-18 (×4): qty 100

## 2019-05-18 MED ORDER — APIXABAN 5 MG PO TABS
10.0000 mg | ORAL_TABLET | Freq: Two times a day (BID) | ORAL | Status: DC
  Administered 2019-05-18 – 2019-05-22 (×8): 10 mg via ORAL
  Filled 2019-05-18 (×8): qty 2

## 2019-05-18 MED ORDER — POTASSIUM CHLORIDE 10 MEQ/100ML IV SOLN
INTRAVENOUS | Status: AC
  Administered 2019-05-18: 01:00:00 10 meq via INTRAVENOUS
  Filled 2019-05-18: qty 100

## 2019-05-18 MED ORDER — LOPERAMIDE HCL 2 MG PO CAPS
2.0000 mg | ORAL_CAPSULE | ORAL | Status: DC | PRN
  Administered 2019-05-18 – 2019-05-20 (×8): 2 mg via ORAL
  Filled 2019-05-18 (×8): qty 1

## 2019-05-18 MED ORDER — POTASSIUM CHLORIDE CRYS ER 20 MEQ PO TBCR
40.0000 meq | EXTENDED_RELEASE_TABLET | Freq: Once | ORAL | Status: AC
  Administered 2019-05-18: 40 meq via ORAL
  Filled 2019-05-18: qty 2

## 2019-05-18 MED ORDER — APIXABAN 5 MG PO TABS: 5.0000 mg | ORAL_TABLET | Freq: Two times a day (BID) | ORAL | Status: DC

## 2019-05-18 MED ORDER — POTASSIUM CHLORIDE 10 MEQ/100ML IV SOLN
10.0000 meq | INTRAVENOUS | Status: AC
  Administered 2019-05-18 (×4): 10 meq via INTRAVENOUS
  Filled 2019-05-18 (×4): qty 100

## 2019-05-18 MED ORDER — INSULIN ASPART 100 UNIT/ML ~~LOC~~ SOLN
0.0000 [IU] | Freq: Three times a day (TID) | SUBCUTANEOUS | Status: DC
  Administered 2019-05-19 (×2): 1 [IU] via SUBCUTANEOUS
  Administered 2019-05-20: 10:00:00 3 [IU] via SUBCUTANEOUS
  Administered 2019-05-20: 19:00:00 2 [IU] via SUBCUTANEOUS
  Administered 2019-05-20 – 2019-05-21 (×2): 1 [IU] via SUBCUTANEOUS
  Administered 2019-05-21 – 2019-05-22 (×3): 2 [IU] via SUBCUTANEOUS

## 2019-05-18 MED ORDER — POTASSIUM CHLORIDE CRYS ER 20 MEQ PO TBCR
40.0000 meq | EXTENDED_RELEASE_TABLET | Freq: Once | ORAL | Status: AC
  Administered 2019-05-18: 21:00:00 40 meq via ORAL
  Filled 2019-05-18: qty 2

## 2019-05-18 NOTE — Progress Notes (Signed)
ANTICOAGULATION CONSULT NOTE - Follow Up Consult  Pharmacy Consult for Heparin Indication: pulmonary embolus  No Known Allergies  Patient Measurements: Height: 5\' 11"  (180.3 cm) Weight: 190 lb (86.2 kg) IBW/kg (Calculated) : 75.3 Heparin Dosing Weight: 86 kg  Vital Signs: Temp: 99.3 F (37.4 C) (09/04 0019) Temp Source: Oral (09/04 0019) BP: 136/77 (09/04 0018) Pulse Rate: 95 (09/04 0018)  Labs: Recent Labs    05/16/19 0725  05/17/19 0229 05/17/19 1034 05/18/19 0238  HGB 13.1  --  11.6*  --  11.6*  HCT 38.5*  --  33.7*  --  33.6*  PLT 224  --  155  --  190  HEPARINUNFRC 0.24*   < > 0.29* 0.37 0.29*  CREATININE 0.98  --   --   --  0.78   < > = values in this interval not displayed.    Estimated Creatinine Clearance: 102 mL/min (by C-G formula based on SCr of 0.78 mg/dL).  Assessment:  12 YOM who presented on 8/25 with abdominal pain and concern for pancreatitis and possible perforated peptic ulcer. The patient found have an acute small B/L PE per CTA on 9/1 and pharmacy consulted to start Heparin for anticoagulation.  GI okayed start of anticoagulation and noted that there were no current signs of bleeding and probability of hemorrhagic ulcer and/or conversion to a pancreatitic parenchymal hemorrhage is low.   Heparin level this am 0.29 units/ml.  No bleeding reported.  No issues with infusion per RN  Goal of Therapy:  Heparin level 0.3-0.7 units/ml Monitor platelets by anticoagulation protocol: Yes   Plan:   Increase heparin drip to 1950 units/hr.  Daily heparin level and CBC.  Monitor for any further blood on tissue, s/sx bleeding.  Noted plan for NOAC prior to discharge.   Thanks for allowing pharmacy to be a part of this patient's care.  Excell Seltzer, PharmD Clinical Pharmacist

## 2019-05-18 NOTE — Plan of Care (Signed)
  Problem: Nutrition: Goal: Adequate nutrition will be maintained Outcome: Not Progressing  Decreased appetite. PO encouraged.   Problem: Elimination: Goal: Will not experience complications related to bowel motility Outcome: Not Progressing  Multiple episodes of diarrhea. Medication provided.

## 2019-05-18 NOTE — Progress Notes (Signed)
  Echocardiogram 2D Echocardiogram has been performed.  Mark Kidd 05/18/2019, 10:52 AM

## 2019-05-18 NOTE — Progress Notes (Signed)
ANTICOAGULATION CONSULT NOTE - Follow Up Consult  Pharmacy Consult for Heparin, Apixaban Indication: pulmonary embolus  No Known Allergies  Patient Measurements: Height: 5\' 11"  (180.3 cm) Weight: 190 lb (86.2 kg) IBW/kg (Calculated) : 75.3 Heparin Dosing Weight: 86 kg  Vital Signs: Temp: 101.5 F (38.6 C) (09/04 1658) Temp Source: Oral (09/04 0806) BP: 154/73 (09/04 1658) Pulse Rate: 107 (09/04 1658)  Labs: Recent Labs    05/16/19 0725  05/17/19 0229 05/17/19 1034 05/18/19 0238  HGB 13.1  --  11.6*  --  11.6*  HCT 38.5*  --  33.7*  --  33.6*  PLT 224  --  155  --  190  HEPARINUNFRC 0.24*   < > 0.29* 0.37 0.29*  CREATININE 0.98  --   --   --  0.78   < > = values in this interval not displayed.    Estimated Creatinine Clearance: 102 mL/min (by C-G formula based on SCr of 0.78 mg/dL).  Assessment:  63 yr old man who presented on 8/25 with abdominal pain and concern for pancreatitis and possible perforated peptic ulcer. The patient found have acute small bilateral PE per CTA on 9/1 and pharmacy was consulted to start heparin for anticoagulation.  GI okayed start of anticoagulation and noted that there were no current signs of bleeding and probability of hemorrhagic ulcer and/or conversion to a pancreatitic parenchymal hemorrhage is low.   Now, pharmacy is consulted to transition patient's anticoagulation therapy from IV heparin infusion to PO apixaban. Per RN, pt has no signs/symptoms of bleeding.  Goal of Therapy:  Monitor platelets by anticoagulation protocol: Yes   Plan:  Discontinue IV heparin infusion Start apixaban 10 mg PO BID X 7 days, followed by 5 mg PO BID thereafter (start apixaban at the time of heparin discontinuation) Daily CBC Monitor for signs/symptoms of bleeding  Gillermina Hu, PharmD, BCPS, [facility] Clinical Pharmacist

## 2019-05-18 NOTE — Progress Notes (Signed)
Hgb stable, stool heme neg on heparin.  Diarr (small amnt's of llquid stool, w/ urination) continues--antbx d/c'd yest.  Still no pain, eating ok.  Recommendations:  1. Ok for dischg from GI standpoint whenever other issues (eg low K+) are adequately addressed.  2. Would continue bid PPI post dischg, ok to stop sucralfate at time of dischg  3. Will start low-dose Imodium  4. Pt instructed to call our office for f/u w/ Dr. Michail Sermon in approx 1-2 wks  Cleotis Nipper, M.D. Pager [phone] If no answer or after 5 PM call [phone]

## 2019-05-18 NOTE — Progress Notes (Signed)
PROGRESS NOTE  Mark Kidd WUJ:[id number] DOB: [birth year] DOA: 05/07/2019 PCP: Lorene Dy, MD  Brief History   Mark Kidd  is a 63 y.o. male, with history of diabetes mellitus type 2, hyperlipidemia, hypertension who came to hospital with epigastric pain which started last night.  Patient says that pain was very severe and kind of eased off around 1 AM.  In the morning though he felt better.  As soon as he ate food the pain returned after 1 hour.  He was also sweating and in extreme discomfort due to pain.  Patient says that he had a similar pain around August 10 at that time he was seen by his PCP and was prescribed omeprazole which did not help the pain.  He had one episode of vomiting in the hospital. He denies vomiting any blood. Denies black stool. Denies blood in the stool. Denies previous history of gallstones. Denies chest pain or shortness of breath. Denies fever or chills.  In the ED CT scan of the abdomen pelvis showed marked retroperitoneal inflammatory changes around second and third part of duodenum concerning for perforated peptic ulcer, also surrounding inflammation of pancreas.  Lipase significantly elevated to 6000, calcification noted at ampulla of greater concern for stone.  Both GI, Nandigam  and general surgery Dr Dema Severin were consulted at [facility] by Dr. Lacinda Axon.  They both will see patient as consult when patient reaches [facility].  No urgent surgery recommended as per conversation of Dr. Lacinda Axon and Dr. Dema Severin.  The patient has been admitted to a medical bed. He has continued to have abdominal pain. He has been seen by Dr. Dema Severin and Dr. Oletta Lamas.  On 05/09/2019 the patient had improvement in lipase, but increase in bilirubin and WBC. MRCP demonstrated no biliary obstruction. It does demonstrated that the inflammed pancreas appears to impinge on the CBD resulting in narrowing at the distal end.  On the evening of 05/14/2019 a rapid response was called on the patient due to fever,  tachypnea, and tachycardia.   CT abdomen and pelvix performed on 05/14/2019 demonstrates evolution of the pancreatitis with increased edema and inflammation.  CTA chest on 05/15/2019 was performed due to the patient's ongoing tachypnea, although he has had no hypoxia.  demonstrated small bilateral pulmonary emboli located in the lower lobes and the right middle lobe. I discussed the patient with Dr. Cristina Gong as the CT abdomen and pelvis continued to suggest ulceration in the stomach and duodenum. Dr. Cristina Gong stated that he was comfortably with the initiation of anticoagulation, but recommended the addition of sucralfate.   The patient had another episode of tachypnea and tachycardia in the setting of fevers on 05/15/2019. Blood cultures x 2 have been ordered. They have had no growth. The patient has continued to have intermittent fevers and WBC remains 20K. I have consulted infectious disease to help determine if there is a reason for concern or continued antibiotics. Blood cultures repeated. C Diff assay cancelled.  Consultants  . General Surgery . Gastroenterology . Infectious Disease  Procedures  . MRCP  Antibiotics   Anti-infectives (From admission, onward)   Start     Dose/Rate Route Frequency Ordered Stop   05/14/19 1200  meropenem (MERREM) 1 g in sodium chloride 0.9 % 100 mL IVPB  Status:  Discontinued     1 g 200 mL/hr over 30 Minutes Intravenous Every 8 hours 05/14/19 1115 05/18/19 0656   05/09/19 0930  cefTRIAXone (ROCEPHIN) 2 g in sodium chloride 0.9 % 100 mL IVPB  Status:  Discontinued     2 g 200 mL/hr over 30 Minutes Intravenous Every 24 hours 05/09/19 0926 05/14/19 1051     Subjective  The patient is sitting up at bedside. The patient does not appear dyspneic today.  Objective   Vitals:  Vitals:   05/18/19 1509 05/18/19 1658  BP: (!) 145/87 (!) 154/73  Pulse: (!) 101 (!) 107  Resp:  20  Temp: (!) 100.9 F (38.3 C) (!) 101.5 F (38.6 C)  SpO2: 93% 96%    Exam:   Constitutional:  . The patient is resting comfortably. No acute distress. Respiratory:  . No increased work of breathing. . No murmurs, ectopy or gallups. . No lateral PMI. No thrills. Cardiovascular:  . Regular rate and rhythm . No murmurs, ectopy, or gallups . No lateral PMI. No thrills. Abdomen:  . Abdomen is distended and diffusely tender. . No hernias, masses, or organomegaly. Marland Kitchen Positive for tympany with percussion. . Tinkling bowel sounds. Musculoskeletal:  . No cyanosis, clubbing, or edema Skin:  . No rashes, lesions, ulcers . palpation of skin: no induration or nodules Neurologic:  . CN 2-12 intact . Sensation all 4 extremities intact Psychiatric:  . Mental status o Mood, affect appropriate o Orientation to person, place, time  . judgment and insight appear intact    I have personally reviewed the following:   Today's Data  . Vitals, CBC, CMP  Scheduled Meds: . carvedilol  6.25 mg Oral BID WC  . cycloSPORINE  1 drop Both Eyes BID  . insulin aspart  0-9 Units Subcutaneous Q4H  . omeprazole  40 mg Oral BID AC  . saccharomyces boulardii  250 mg Oral BID  . sucralfate  1 g Oral Q6H   Continuous Infusions: . sodium chloride 50 mL/hr at 05/18/19 1200  . heparin 1,950 Units/hr (05/18/19 1200)  . potassium chloride 10 mEq (05/18/19 1717)    No problems updated.   LOS: 11 days   A & P  Perforated duodenal ulcer-as per CT scan abdomen pelvis as above, patient is clinically stable.  Vitals are stable, does not have rigidity or guarding on abdominal examination.  General surgery has been consulted by ED physician, Dr. Dema Severin will see patient when patient arrives at [facility].  We will keep him n.p.o., dilaudid, IV normal saline at 125 mill per hour. General surgery wishes to treat this problem conservatively with close monitoring.No bleeding ulcer per GI. Pt will need to follow up with GI as outpatient for biopsies of the duodenal lesion.  Acute  pancreatitis-lipase significantly elevated 6889, AST 514, ALT 378, alk phos 91, total bilirubin 1.8. CT abdomen also shows punctate calcification in the duodenum near the ampulla which could reflect an ampullary stone.  GI has been consulted.  GI has seen the patient.Pain control and IV fluids as above. Lipase is down to 25 on 05/15/2019. He is on a soft diet. Lactic acid is 1.2. There is still a significant amount of edema and inflammation about the pancreas. It is quite possible that the continued fevers and leukocytosis are just reactive from this ongoing process. Antibiotics have been discontinued. We will continue to monitor the patient. I appreciate Dr. Osborn Coho assistance.  Pulmonary emboli: Pt has been started on a heparin drip. Will transition to [place] before discharge.  Leukocytosis: Increased. Possibly due to ongoing inflammation in the pancreas or pulmonary embolus. The patient had another episode of tachypnea and tachycardia in the setting of fevers on 05/15/2019. Blood cultures x  2 have been ordered. They have had no growth. The patient has continued to have intermittent fevers and WBC remains 20K. I have consulted infectious disease to help determine if there is a reason for concern or continued antibiotics.There is still a significant amount of edema and inflammation about the pancreas. It is quite possible that the continued fevers and leukocytosis are just reactive from this ongoing process. Antibiotics have been discontinued. The leukocytosis and fevers may also be due to pulmonary emboli. We will monitor the patient off of antibiotics as per infectious disease.  Diarrhea: Not true diarrhea. Nor is it similar to C Diff colitis. The patient has some semi-liquid/solid stool with each trip to urinate. I had the privilege of seeing the patient with Dr. Cristina Gong. He has placed the patient on loperimide to help with this.  Hyperbilirubinemia: Bilirubin is down to 2.1 from 9.0. WBC down to 11.8. MRCP  demonstrated no obstruction with biliary stone, however it appears that the inflammed pancreas may be narrowing the distal end of the duct.  Diabetes mellitus type 2-we will initiate sliding scale insulin with NovoLog. Check CBG every 4 hours.  Metformin on hold.  Hypertension-blood pressure stable, patient takes Coreg 6.25 mg every evening.    I have seen and examined this patient myself. I have spent 35 minutes in his evaluation and care.  DVT Prophylaxis:  Lovenox  Family Communication: The patient's wife is at bedside. Code Status: Full code Disposition: Home  Claudett Bayly, DO [facility] Direct contact: see [email or web address]  7PM-7AM contact night coverage as above 05/17/2019, 7:03 PM  LOS: 1 day

## 2019-05-19 LAB — COMPREHENSIVE METABOLIC PANEL
ALT: 46 U/L — ABNORMAL HIGH (ref 0–44)
AST: 48 U/L — ABNORMAL HIGH (ref 15–41)
Albumin: 1.8 g/dL — ABNORMAL LOW (ref 3.5–5.0)
Alkaline Phosphatase: 107 U/L (ref 38–126)
Anion gap: 10 (ref 5–15)
BUN: 6 mg/dL — ABNORMAL LOW (ref 8–23)
CO2: 24 mmol/L (ref 22–32)
Calcium: 7.3 mg/dL — ABNORMAL LOW (ref 8.9–10.3)
Chloride: 99 mmol/L (ref 98–111)
Creatinine, Ser: 0.82 mg/dL (ref 0.61–1.24)
GFR calc Af Amer: >60 mL/min (ref >60)
GFR calc non Af Amer: >60 mL/min (ref >60)
Glucose, Bld: 124 mg/dL — ABNORMAL HIGH (ref 70–99)
Potassium: 3.4 mmol/L — ABNORMAL LOW (ref 3.5–5.1)
Sodium: 133 mmol/L — ABNORMAL LOW (ref 135–145)
Total Bilirubin: 1.3 mg/dL — ABNORMAL HIGH (ref 0.3–1.2)
Total Protein: 5.5 g/dL — ABNORMAL LOW (ref 6.5–8.1)

## 2019-05-19 LAB — CULTURE, BLOOD (ROUTINE X 2)
Culture: NO GROWTH
Culture: NO GROWTH
Special Requests: ADEQUATE
Special Requests: ADEQUATE

## 2019-05-19 LAB — CBC WITH DIFFERENTIAL/PLATELET
Abs Immature Granulocytes: 0.4 10*3/uL — ABNORMAL HIGH (ref 0.00–0.07)
Basophils Absolute: 0 10*3/uL (ref 0.0–0.1)
Basophils Relative: 0 %
Eosinophils Absolute: 0 10*3/uL (ref 0.0–0.5)
Eosinophils Relative: 0 %
HCT: 33.6 % — ABNORMAL LOW (ref 39.0–52.0)
Hemoglobin: 11.7 g/dL — ABNORMAL LOW (ref 13.0–17.0)
Immature Granulocytes: 2 %
Lymphocytes Relative: 5 %
Lymphs Abs: 0.9 10*3/uL (ref 0.7–4.0)
MCH: 30.3 pg (ref 26.0–34.0)
MCHC: 34.8 g/dL (ref 30.0–36.0)
MCV: 87 fL (ref 80.0–100.0)
Monocytes Absolute: 1.5 10*3/uL — ABNORMAL HIGH (ref 0.1–1.0)
Monocytes Relative: 7 %
Neutro Abs: 16.9 10*3/uL — ABNORMAL HIGH (ref 1.7–7.7)
Neutrophils Relative %: 86 %
Platelets: 218 10*3/uL (ref 150–400)
RBC: 3.86 MIL/uL — ABNORMAL LOW (ref 4.22–5.81)
RDW: 13.7 % (ref 11.5–15.5)
WBC: 19.8 10*3/uL — ABNORMAL HIGH (ref 4.0–10.5)
nRBC: 0 % (ref 0.0–0.2)

## 2019-05-19 LAB — GLUCOSE, CAPILLARY
Glucose-Capillary: 103 mg/dL — ABNORMAL HIGH (ref 70–99)
Glucose-Capillary: 132 mg/dL — ABNORMAL HIGH (ref 70–99)
Glucose-Capillary: 137 mg/dL — ABNORMAL HIGH (ref 70–99)
Glucose-Capillary: 139 mg/dL — ABNORMAL HIGH (ref 70–99)
Glucose-Capillary: 140 mg/dL — ABNORMAL HIGH (ref 70–99)
Glucose-Capillary: 169 mg/dL — ABNORMAL HIGH (ref 70–99)

## 2019-05-19 LAB — PROCALCITONIN: Procalcitonin: 0.39 ng/mL

## 2019-05-19 MED ORDER — DEXTROSE-NACL 5-0.9 % IV SOLN
INTRAVENOUS | Status: DC
  Administered 2019-05-19: 06:00:00 via INTRAVENOUS

## 2019-05-19 MED ORDER — POTASSIUM CHLORIDE CRYS ER 20 MEQ PO TBCR
40.0000 meq | EXTENDED_RELEASE_TABLET | Freq: Once | ORAL | Status: AC
  Administered 2019-05-19: 13:00:00 40 meq via ORAL
  Filled 2019-05-19: qty 2

## 2019-05-19 NOTE — Progress Notes (Signed)
T-max 101.5 yesterday afternoon, Tmax 100 since then.  White count unchanged, 19,600.  Transitioned from heparin to Eliquis for treatment of his pulmonary emboli.  BUN and hemoglobin unchanged despite blood noted in toilet this morning in association with bowel movement.  The patient continues to be pain-free.  However, his diarrhea, consisting of frequent small squirts of stool, continues.  However, it is noteworthy that he has only had 1 dose of Imodium since I ordered it yesterday.  Exam: Sitting in bedside chair without any distress, alert and coherent.  Abdomen is mild to moderately distended, slightly tympanitic and moderately firm but nontender.  Sparse bowel sounds present.  No jaundice, mild peripheral edema.  Labs: Per above  Impression:  1.  Persistent leukocytosis and low-grade fevers which I think are most likely going to end up being a consequence of residual inflammation following his significant pancreatitis attack.  2.  Minimal hematochezia, on heparin, most consistent with rectal outlet bleeding associated with his diarrhea.  No evidence of clinically significant GI bleeding by vital signs, history, or labs.  3.  Ongoing small-volume diarrhea, despite being off antibiotics and on probiotics.  Recommendations:  1.  Continue clinical monitoring, including fever and labs.  Not clear exactly what our endpoint should be for his discharge, since it appears that these will probably be continuing for a while, in the absence of any clinically overt clinical problem.  2.  Patient was reinstructed in the use of Imodium and was encouraged to call the nurse each time he has a loose bowel movement so he can take a dose of Imodium which will hopefully control diarrhea for later in the day.  Cleotis Nipper, M.D. Pager [phone] If no answer or after 5 PM call [phone]

## 2019-05-19 NOTE — Progress Notes (Signed)
Called to room by patient due to blood in his stool.  Upon looking in the toilet the stool was visibly bloody.  Vital signs are stable and patient denies other complaints.  Mark Kidd notified via text page and new orders were placed in [facility] and carried out

## 2019-05-19 NOTE — Progress Notes (Signed)
1218 pt's RR elevated, MEWS alert fired for that alone. Pt is resting in chair, no distress, has rapid resp rate when talking. Pt is not dyspnic, lung sounds are clear. Of note, when assessing patient, noted that resp leads are picking up other motion: rate is 20-24 as opposed to the unvalidated data which is reading 30s to 40s. Will continue to monitor patient carefully for deterioration.    Vital Signs MEWS/VS Documentation      05/19/2019 0930 05/19/2019 0936 05/19/2019 1218 05/19/2019 1357   MEWS Score:  0  0  3  2   MEWS Score Color:  Green  Green  Yellow  Yellow   Resp:  -  20  (!) 36  (!) 22   Pulse:  -  93  93  100   BP:  -  (!) 143/84  138/85  -   Temp:  -  99.6 F (37.6 C)  100 F (37.8 C)  -   O2 Device:  -  Room Air  Room Air  -   Level of Consciousness:  Alert  -  -  Barnetta Chapel  Lester Crickenberger 05/19/2019,2:08 PM

## 2019-05-19 NOTE — Discharge Instructions (Addendum)
Information on my medicine - ELIQUIS (apixaban)  Why was Eliquis prescribed for you? Eliquis was prescribed to treat blood clots that may have been found in the veins of your legs (deep vein thrombosis) or in your lungs (pulmonary embolism) and to reduce the risk of them occurring again.  What do You need to know about Eliquis ? The starting dose is 10 mg (two 5 mg tablets) taken TWICE daily for the FIRST SEVEN (7) DAYS, then on 05/25/2019 the dose is reduced to ONE 5 mg tablet taken TWICE daily.  Eliquis may be taken with or without food.   Try to take the dose about the same time in the morning and in the evening. If you have difficulty swallowing the tablet whole please discuss with your pharmacist how to take the medication safely.  Take Eliquis exactly as prescribed and DO NOT stop taking Eliquis without talking to the doctor who prescribed the medication.  Stopping may increase your risk of developing a new blood clot.  Refill your prescription before you run out.  After discharge, you should have regular check-up appointments with your healthcare provider that is prescribing your Eliquis.    What do you do if you miss a dose? If a dose of ELIQUIS is not taken at the scheduled time, take it as soon as possible on the same day and twice-daily administration should be resumed. The dose should not be doubled to make up for a missed dose.  Important Safety Information A possible side effect of Eliquis is bleeding. You should call your healthcare provider right away if you experience any of the following: ? Bleeding from an injury or your nose that does not stop. ? Unusual colored urine (red or dark brown) or unusual colored stools (red or black). ? Unusual bruising for unknown reasons. ? A serious fall or if you hit your head (even if there is no bleeding).  Some medicines may interact with Eliquis and might increase your risk of bleeding or clotting while on Eliquis. To help  avoid this, consult your healthcare provider or pharmacist prior to using any new prescription or non-prescription medications, including herbals, vitamins, non-steroidal anti-inflammatory drugs (NSAIDs) and supplements.  This website has more information on Eliquis (apixaban): http://[email or web address].com/eliquis/home

## 2019-05-19 NOTE — Progress Notes (Signed)
PROGRESS NOTE  Mark Kidd JTT:[id number] DOB: [birth year] DOA: 05/07/2019 PCP: Lorene Dy, MD  Brief History   Mark Kidd  is a 63 y.o. male, with history of diabetes mellitus type 2, hyperlipidemia, hypertension who came to hospital with epigastric pain which started last night.  Patient says that pain was very severe and kind of eased off around 1 AM.  In the morning though he felt better.  As soon as he ate food the pain returned after 1 hour.  He was also sweating and in extreme discomfort due to pain.  Patient says that he had a similar pain around August 10 at that time he was seen by his PCP and was prescribed omeprazole which did not help the pain.  He had one episode of vomiting in the hospital. He denies vomiting any blood. Denies black stool. Denies blood in the stool. Denies previous history of gallstones. Denies chest pain or shortness of breath. Denies fever or chills.  In the ED CT scan of the abdomen pelvis showed marked retroperitoneal inflammatory changes around second and third part of duodenum concerning for perforated peptic ulcer, also surrounding inflammation of pancreas.  Lipase significantly elevated to 6000, calcification noted at ampulla of greater concern for stone.  Both GI, Nandigam  and general surgery Dr Dema Severin were consulted at [facility] by Dr. Lacinda Axon.  They both will see patient as consult when patient reaches [facility].  No urgent surgery recommended as per conversation of Dr. Lacinda Axon and Dr. Dema Severin.  The patient has been admitted to a medical bed. He has continued to have abdominal pain. He has been seen by Dr. Dema Severin and Dr. Oletta Lamas.  On 05/09/2019 the patient had improvement in lipase, but increase in bilirubin and WBC. MRCP demonstrated no biliary obstruction. It does demonstrated that the inflammed pancreas appears to impinge on the CBD resulting in narrowing at the distal end.  On the evening of 05/14/2019 a rapid response was called on the patient due to fever,  tachypnea, and tachycardia.   CT abdomen and pelvix performed on 05/14/2019 demonstrates evolution of the pancreatitis with increased edema and inflammation.  CTA chest on 05/15/2019 was performed due to the patient's ongoing tachypnea, although he has had no hypoxia.  demonstrated small bilateral pulmonary emboli located in the lower lobes and the right middle lobe. I discussed the patient with Dr. Cristina Gong as the CT abdomen and pelvis continued to suggest ulceration in the stomach and duodenum. Dr. Cristina Gong stated that he was comfortably with the initiation of anticoagulation, but recommended the addition of sucralfate.   The patient had another episode of tachypnea and tachycardia in the setting of fevers on 05/15/2019. Blood cultures x 2 have been ordered. They have had no growth. The patient has continued to have intermittent fevers and WBC remains 20K. I have consulted infectious disease to help determine if there is a reason for concern or continued antibiotics. Blood cultures repeated. C Diff assay cancelled.  The patient tells me that he had blood dripping from his rectum after having a BM this morning. He stated that the BM was normal. I have discussed the patient with Dr. Cristina Gong who advises that we continue the Eliquis and monitor hemoglobin.  Consultants  . General Surgery . Gastroenterology . Infectious Disease  Procedures  . MRCP  Antibiotics   Anti-infectives (From admission, onward)   Start     Dose/Rate Route Frequency Ordered Stop   05/14/19 1200  meropenem (MERREM) 1 g in sodium chloride 0.9 %  100 mL IVPB  Status:  Discontinued     1 g 200 mL/hr over 30 Minutes Intravenous Every 8 hours 05/14/19 1115 05/18/19 0656   05/09/19 0930  cefTRIAXone (ROCEPHIN) 2 g in sodium chloride 0.9 % 100 mL IVPB  Status:  Discontinued     2 g 200 mL/hr over 30 Minutes Intravenous Every 24 hours 05/09/19 0926 05/14/19 1051     Subjective  The patient is sitting up at bedside. He appears  anxious. He is complaining of Bright red blood per rectum.  Objective   Vitals:  Vitals:   05/19/19 1218 05/19/19 1357  BP: 138/85   Pulse: 93 100  Resp: (!) 36 (!) 22  Temp: 100 F (37.8 C)   SpO2: 94% 93%    Exam:  Constitutional:  . The patient is resting comfortably. No acute distress. Respiratory:  . No increased work of breathing. . No murmurs, ectopy or gallups. . No lateral PMI. No thrills. Cardiovascular:  . Regular rate and rhythm . No murmurs, ectopy, or gallups . No lateral PMI. No thrills. Abdomen:  . Abdomen is distended and diffusely tender. . No hernias, masses, or organomegaly. Marland Kitchen Positive for tympany with percussion. . Tinkling bowel sounds. Musculoskeletal:  . No cyanosis, clubbing, or edema Skin:  . No rashes, lesions, ulcers . palpation of skin: no induration or nodules Neurologic:  . CN 2-12 intact . Sensation all 4 extremities intact Psychiatric:  . Mental status o Mood, affect appropriate o Orientation to person, place, time  . judgment and insight appear intact    I have personally reviewed the following:   Today's Data  . Vitals, CBC, CMP  Scheduled Meds: . apixaban  10 mg Oral BID   Followed by  . [START ON 05/25/2019] apixaban  5 mg Oral BID  . carvedilol  6.25 mg Oral BID WC  . cycloSPORINE  1 drop Both Eyes BID  . insulin aspart  0-9 Units Subcutaneous TID WC  . omeprazole  40 mg Oral BID AC  . saccharomyces boulardii  250 mg Oral BID  . sucralfate  1 g Oral Q6H   Continuous Infusions:   No problems updated.   LOS: 12 days   A & P  Perforated duodenal ulcer-as per CT scan abdomen pelvis as above, patient is clinically stable.  Vitals are stable, does not have rigidity or guarding on abdominal examination.  General surgery has been consulted by ED physician, Dr. Dema Severin will see patient when patient arrives at [facility].  We will keep him n.p.o., dilaudid, IV normal saline at 125 mill per hour. General surgery wishes to  treat this problem conservatively with close monitoring.No bleeding ulcer per GI. Pt will need to follow up with GI as outpatient for biopsies of the duodenal lesion.  Bright Red Blood Per Rectum: Drips of blood from rectum following BM this morning. H&H stable. I have discussed the patient with Dr. Cristina Gong. He advises following hemoglobin and continuing Eliquis.  Acute pancreatitis-lipase significantly elevated 6889, AST 514, ALT 378, alk phos 91, total bilirubin 1.8. CT abdomen also shows punctate calcification in the duodenum near the ampulla which could reflect an ampullary stone.  GI has been consulted.  GI has seen the patient.Pain control and IV fluids as above. Lipase is down to 25 on 05/15/2019. He is on a soft diet. Lactic acid is 1.2. There is still a significant amount of edema and inflammation about the pancreas. It is quite possible that the continued fevers and  leukocytosis are just reactive from this ongoing process. Antibiotics have been discontinued. We will continue to monitor the patient. I appreciate Dr. Osborn Coho assistance.  Pulmonary emboli: Pt has been started on a heparin drip. Pt has been converted to Eliquis. Managed by pharmacy.  Leukocytosis: Increased. Possibly due to ongoing inflammation in the pancreas or pulmonary embolus. The patient had another episode of tachypnea and tachycardia in the setting of fevers on 05/15/2019. Blood cultures x 2 have been ordered. They have had no growth. The patient has continued to have intermittent fevers and WBC remains 20K. I have consulted infectious disease to help determine if there is a reason for concern or continued antibiotics.There is still a significant amount of edema and inflammation about the pancreas. It is quite possible that the continued fevers and leukocytosis are just reactive from this ongoing process. Antibiotics have been discontinued. The leukocytosis and fevers may also be due to pulmonary emboli. We will monitor the  patient off of antibiotics as per infectious disease.  Diarrhea: Not true diarrhea. Nor is it similar to C Diff colitis. The patient has some semi-liquid/solid stool with each trip to urinate. I had the privilege of seeing the patient with Dr. Cristina Gong. He has placed the patient on loperimide to help with this. 1 BM today.  Hyperbilirubinemia: Bilirubin is down to 2.1 from 9.0. WBC down to 11.8. MRCP demonstrated no obstruction with biliary stone, however it appears that the inflammed pancreas may be narrowing the distal end of the duct.  Diabetes mellitus type 2-we will initiate sliding scale insulin with NovoLog. Check CBG every 4 hours.  Metformin on hold.  Hypertension-blood pressure stable, patient takes Coreg 6.25 mg every evening.    I have seen and examined this patient myself. I have spent 42 minutes in his evaluation and care.  DVT Prophylaxis:  Lovenox  Family Communication: The patient's wife is at bedside. Code Status: Full code Disposition: Home  Mark Lamb, DO [facility] Direct contact: see [email or web address]  7PM-7AM contact night coverage as above 05/19/2019, 5:05 PM  LOS: 1 day

## 2019-05-20 ENCOUNTER — Inpatient Hospital Stay (HOSPITAL_COMMUNITY): Payer: 59

## 2019-05-20 DIAGNOSIS — R0682 Tachypnea, not elsewhere classified: Secondary | ICD-10-CM

## 2019-05-20 DIAGNOSIS — K625 Hemorrhage of anus and rectum: Secondary | ICD-10-CM

## 2019-05-20 LAB — COMPREHENSIVE METABOLIC PANEL
ALT: 38 U/L (ref 0–44)
AST: 37 U/L (ref 15–41)
Albumin: 1.7 g/dL — ABNORMAL LOW (ref 3.5–5.0)
Alkaline Phosphatase: 89 U/L (ref 38–126)
Anion gap: 9 (ref 5–15)
BUN: 12 mg/dL (ref 8–23)
CO2: 25 mmol/L (ref 22–32)
Calcium: 7.4 mg/dL — ABNORMAL LOW (ref 8.9–10.3)
Chloride: 101 mmol/L (ref 98–111)
Creatinine, Ser: 0.93 mg/dL (ref 0.61–1.24)
GFR calc Af Amer: >60 mL/min (ref >60)
GFR calc non Af Amer: >60 mL/min (ref >60)
Glucose, Bld: 132 mg/dL — ABNORMAL HIGH (ref 70–99)
Potassium: 3.2 mmol/L — ABNORMAL LOW (ref 3.5–5.1)
Sodium: 135 mmol/L (ref 135–145)
Total Bilirubin: 1 mg/dL (ref 0.3–1.2)
Total Protein: 5.4 g/dL — ABNORMAL LOW (ref 6.5–8.1)

## 2019-05-20 LAB — CBC
HCT: 29.9 % — ABNORMAL LOW (ref 39.0–52.0)
Hemoglobin: 10.3 g/dL — ABNORMAL LOW (ref 13.0–17.0)
MCH: 30.1 pg (ref 26.0–34.0)
MCHC: 34.4 g/dL (ref 30.0–36.0)
MCV: 87.4 fL (ref 80.0–100.0)
Platelets: 243 10*3/uL (ref 150–400)
RBC: 3.42 MIL/uL — ABNORMAL LOW (ref 4.22–5.81)
RDW: 13.6 % (ref 11.5–15.5)
WBC: 17.9 10*3/uL — ABNORMAL HIGH (ref 4.0–10.5)
nRBC: 0 % (ref 0.0–0.2)

## 2019-05-20 LAB — GLUCOSE, CAPILLARY
Glucose-Capillary: 203 mg/dL — ABNORMAL HIGH (ref 70–99)
Glucose-Capillary: 123 mg/dL — ABNORMAL HIGH (ref 70–99)
Glucose-Capillary: 158 mg/dL — ABNORMAL HIGH (ref 70–99)
Glucose-Capillary: 179 mg/dL — ABNORMAL HIGH (ref 70–99)

## 2019-05-20 MED ORDER — POTASSIUM CHLORIDE CRYS ER 20 MEQ PO TBCR
40.0000 meq | EXTENDED_RELEASE_TABLET | ORAL | Status: AC
  Administered 2019-05-20 (×2): 40 meq via ORAL
  Filled 2019-05-20 (×2): qty 2

## 2019-05-20 MED ORDER — POLYETHYLENE GLYCOL 3350 17 G PO PACK
17.0000 g | PACK | Freq: Two times a day (BID) | ORAL | Status: DC
  Administered 2019-05-20 – 2019-05-22 (×3): 17 g via ORAL
  Filled 2019-05-20 (×4): qty 1

## 2019-05-20 NOTE — Progress Notes (Signed)
Pt's resp rate and heart rate elevated - pt in no distress. MEWS alert firing; I let Dr Benny Lennert know, she is aware. Continue to monitor.    Vital Signs MEWS/VS Documentation      05/20/2019 0700 05/20/2019 0812 05/20/2019 1000 05/20/2019 1024   MEWS Score:  3  5  3  3    MEWS Score Color:  Yellow  Red  Yellow  Yellow   Resp:  -  (!) 52  (!) 34  (!) 32   Pulse:  -  (!) 115  (!) 109  (!) 108   BP:  -  (!) 142/78  -  (!) 151/74   Temp:  -  98.3 F (36.8 C)  -  99.2 F (37.3 C)   O2 Device:  -  Room Air  -  Room Air   Level of Consciousness:  -  -  Alert  -           Mark Kidd 05/20/2019,11:18 AM

## 2019-05-20 NOTE — Progress Notes (Signed)
PROGRESS NOTE  Mark Kidd PYK:[id number] DOB: 1956-03-02 DOA: 05/07/2019 PCP: Lorene Dy, MD  Brief History   Mark Kidd  is a 63 y.o. male, with history of diabetes mellitus type 2, hyperlipidemia, hypertension who came to hospital with epigastric pain which started last night.  Patient says that pain was very severe and kind of eased off around 1 AM.  In the morning though he felt better.  As soon as he ate food the pain returned after 1 hour.  He was also sweating and in extreme discomfort due to pain.  Patient says that he had a similar pain around August 10 at that time he was seen by his PCP and was prescribed omeprazole which did not help the pain.  He had one episode of vomiting in the hospital. He denies vomiting any blood. Denies black stool. Denies blood in the stool. Denies previous history of gallstones. Denies chest pain or shortness of breath. Denies fever or chills.  In the ED CT scan of the abdomen pelvis showed marked retroperitoneal inflammatory changes around second and third part of duodenum concerning for perforated peptic ulcer, also surrounding inflammation of pancreas.  Lipase significantly elevated to 6000, calcification noted at ampulla of greater concern for stone.  Both GI, Nandigam  and general surgery Dr Dema Severin were consulted at [facility] by Dr. Lacinda Axon.  They both will see patient as consult when patient reaches [facility].  No urgent surgery recommended as per conversation of Dr. Lacinda Axon and Dr. Dema Severin.  The patient has been admitted to a medical bed. He has continued to have abdominal pain. He has been seen by Dr. Dema Severin and Dr. Oletta Lamas.  On 05/09/2019 the patient had improvement in lipase, but increase in bilirubin and WBC. MRCP demonstrated no biliary obstruction. It does demonstrated that the inflammed pancreas appears to impinge on the CBD resulting in narrowing at the distal end.  On the evening of 05/14/2019 a rapid response was called on the patient due to fever,  tachypnea, and tachycardia.   CT abdomen and pelvix performed on 05/14/2019 demonstrates evolution of the pancreatitis with increased edema and inflammation.  CTA chest on 05/15/2019 was performed due to the patient's ongoing tachypnea, although he has had no hypoxia.  demonstrated small bilateral pulmonary emboli located in the lower lobes and the right middle lobe. I discussed the patient with Dr. Cristina Gong as the CT abdomen and pelvis continued to suggest ulceration in the stomach and duodenum. Dr. Cristina Gong stated that he was comfortably with the initiation of anticoagulation, but recommended the addition of sucralfate.   The patient had another episode of tachypnea and tachycardia in the setting of fevers on 05/15/2019. Blood cultures x 2 have been ordered. They have had no growth. The patient has continued to have intermittent fevers and WBC remains 20K. I have consulted infectious disease to help determine if there is a reason for concern or continued antibiotics. Blood cultures repeated. C Diff assay cancelled.  The patient tells me that he had blood dripping from his rectum after having a BM this morning. He stated that the BM was normal. I have discussed the patient with Dr. Cristina Gong who advises that we continue the Eliquis and monitor hemoglobin. His hemoglobin dropped from 11.7 to 10.3.  Consultants  . General Surgery . Gastroenterology . Infectious Disease  Procedures  . MRCP  Antibiotics   Anti-infectives (From admission, onward)   Start     Dose/Rate Route Frequency Ordered Stop   05/14/19 1200  meropenem (  MERREM) 1 g in sodium chloride 0.9 % 100 mL IVPB  Status:  Discontinued     1 g 200 mL/hr over 30 Minutes Intravenous Every 8 hours 05/14/19 1115 05/18/19 0656   05/09/19 0930  cefTRIAXone (ROCEPHIN) 2 g in sodium chloride 0.9 % 100 mL IVPB  Status:  Discontinued     2 g 200 mL/hr over 30 Minutes Intravenous Every 24 hours 05/09/19 0926 05/14/19 1051     Subjective  The patient  is sitting up at bedside. He appears anxious. He has had another episode of BRBPR as well as increased tachypnea. He states that he feels that this is due to abdominal distention.  Objective   Vitals:  Vitals:   05/20/19 1211 05/20/19 1418  BP: (!) 146/86 113/75  Pulse: (!) 103 98  Resp: (!) 43 (!) 29  Temp: (!) 101.5 F (38.6 C) 98.7 F (37.1 C)  SpO2: 96% 94%    Exam:  Constitutional:  . The patient is resting comfortably. He has notable dyspnea with conversation. Respiratory:  . Positive increased work of breathing with tachypnea. . No murmurs, ectopy or gallups. . No lateral PMI. No thrills. Cardiovascular:  . Regular rate and rhythm . No murmurs, ectopy, or gallups . No lateral PMI. No thrills. Abdomen:  . Abdomen is very distended and diffusely tender. . No hernias, masses, or organomegaly. Marland Kitchen Positive for tympany with percussion. Musculoskeletal:  . No cyanosis, clubbing, or edema Skin:  . No rashes, lesions, ulcers . palpation of skin: no induration or nodules Neurologic:  . CN 2-12 intact . Sensation all 4 extremities intact Psychiatric:  . Mental status o Mood, affect appropriate o Orientation to person, place, time  . judgment and insight appear intact    I have personally reviewed the following:   Today's Data  . Vitals, CBC, CMP  Scheduled Meds: . apixaban  10 mg Oral BID   Followed by  . [START ON 05/25/2019] apixaban  5 mg Oral BID  . carvedilol  6.25 mg Oral BID WC  . cycloSPORINE  1 drop Both Eyes BID  . insulin aspart  0-9 Units Subcutaneous TID WC  . omeprazole  40 mg Oral BID AC  . potassium chloride  40 mEq Oral Q4H  . saccharomyces boulardii  250 mg Oral BID  . sucralfate  1 g Oral Q6H     No problems updated.   LOS: 13 days   A & P  Perforated duodenal ulcer-as per CT scan abdomen pelvis as above, patient is clinically stable.  Vitals are stable, does not have rigidity or guarding on abdominal examination.  General surgery has  been consulted by ED physician, Dr. Dema Severin will see patient when patient arrives at [facility].  We will keep him n.p.o., dilaudid, IV normal saline at 125 mill per hour. General surgery wishes to treat this problem conservatively with close monitoring.No bleeding ulcer per GI. Pt will need to follow up with GI as outpatient for biopsies of the duodenal lesion.  Bright Red Blood Per Rectum: Drips of blood from rectum following BM this morning. H&H stable. I have discussed the patient with Dr. Cristina Gong. He advises following hemoglobin and continuing Eliquis. He dropped his hemoglobin from 11.7 on 05/19/2019 to 10.3 today. Continue to monitor.  Tachypnea: DDx: pulmonary emboli, abdominal distention and upward pressure on diaphragm due to inflammation and edema related to pancreas. Will check abdominal x-ray. Consider pulmonology consult for tachypnea if continues.  Acute pancreatitis-lipase significantly elevated 6889, AST  514, ALT 378, alk phos 91, total bilirubin 1.8. CT abdomen also shows punctate calcification in the duodenum near the ampulla which could reflect an ampullary stone.  GI has been consulted.  GI has seen the patient.Pain control and IV fluids as above. Lipase is down to 25 on 05/15/2019. He is on a soft diet. Lactic acid is 1.2. There is still a significant amount of edema and inflammation about the pancreas. It is quite possible that the continued fevers and leukocytosis are just reactive from this ongoing process. Antibiotics have been discontinued. We will continue to monitor the patient. I appreciate Dr. Osborn Coho assistance.  Pulmonary emboli: Pt has been started on a heparin drip. Pt has been converted to Eliquis. Managed by pharmacy.  Leukocytosis: Increased. Possibly due to ongoing inflammation in the pancreas or pulmonary embolus. The patient had another episode of tachypnea and tachycardia in the setting of fevers on 05/15/2019. Blood cultures x 2 have been ordered. They have had no  growth. The patient has continued to have intermittent fevers and WBC remains 20K. I have consulted infectious disease to help determine if there is a reason for concern or continued antibiotics.There is still a significant amount of edema and inflammation about the pancreas. It is quite possible that the continued fevers and leukocytosis are just reactive from this ongoing process. Antibiotics have been discontinued. The leukocytosis and fevers may also be due to pulmonary emboli. We will monitor the patient off of antibiotics as per infectious disease.  Diarrhea: Not true diarrhea. Nor is it similar to C Diff colitis. The patient has some semi-liquid/solid stool with each trip to urinate. I had the privilege of seeing the patient with Dr. Cristina Gong. He has placed the patient on loperimide to help with this. 1 BM today.  Hyperbilirubinemia: Bilirubin is down to 2.1 from 9.0. WBC down to 11.8. MRCP demonstrated no obstruction with biliary stone, however it appears that the inflammed pancreas may be narrowing the distal end of the duct.  Diabetes mellitus type 2-we will initiate sliding scale insulin with NovoLog. Check CBG every 4 hours.  Metformin on hold.  Hypertension-blood pressure stable, patient takes Coreg 6.25 mg every evening.    I have seen and examined this patient myself. I have spent 35 minutes in his evaluation and care.  DVT Prophylaxis:  Lovenox  Family Communication: The patient's wife is at bedside. Code Status: Full code Disposition: Home  Ava Swayze, DO [facility] Direct contact: see [email or web address]  7PM-7AM contact night coverage as above 05/20/2019, 3:03 PM  LOS: 1 day

## 2019-05-20 NOTE — Progress Notes (Signed)
Pt feels fever has broken. Note lower BP that is in normal range but lower than he has been. Pt reports feeling well, not pain, dyspnea, not noticably short of breath. He reports that the last BM he had actually smelled like stool instead of previous BMs which were liquid and he reports no smell. Charge nurse notified re red MEWS, will initiate frequent vitals.  Vital Signs MEWS/VS Documentation      05/20/2019 1000 05/20/2019 1024 05/20/2019 1211 05/20/2019 1418   MEWS Score:  3  3  6  2    MEWS Score Color:  Yellow  Yellow  Red  Yellow   Resp:  (!) 34  (!) 32  (!) 43  (!) 29   Pulse:  (!) 109  (!) 108  (!) 103  98   BP:  -  (!) 151/74  (!) 146/86  113/75   Temp:  -  99.2 F (37.3 C)  (!) 101.5 F (38.6 C)  98.7 F (37.1 C)   O2 Device:  -  Room Air  -  Room Air   Level of Consciousness:  Alert  -  -  Mark Kidd  Mark Kidd 05/20/2019,2:21 PM

## 2019-05-20 NOTE — Progress Notes (Signed)
Patient continues to feel okay and eating okay.  However, there are several issues:  1.  Persistent leukocytosis, somewhat improved at 17,000 today, with temp of 101.5 earlier today.  2.  Lower extremity edema and anasarca, despite being off IV fluids.  No doubt, this is in part due to low albumin of 1.7.  3.  Patient's bowel function remains somewhat erratic. His stools seem to be "clear squirts", sometimes with some small amount of stool.  Plain abdominal films today show a nonobstructive bowel gas pattern; I have reviewed the films and they do show a moderate amount of formed stool in the proximal colon but no evidence of distal fecal impaction.  4.  There has been a little bit of blood with the stool but it is unclear if this is due to perianal irritation.  However, the patient's hemoglobin today is 1 g lower than yesterday, at 10.3.  On exam, the patient is in no distress.  Perianal exam does not show any obvious external hemorrhoids nor is there tenderness or induration in that area.  Digital exam shows a normal anal canal without palpable hemorrhoids, normal prostate gland, and an empty rectal ampulla that, on the examining glove, had a mixture of scant tan pasty stool and a small amount of serosanguineous fluid.  There was no frank blood on the examining finger.  Recommendations:  1.  Consider repeat CT scan in several days if the leukocytosis does not improve 2.  Consider gentle diuretic therapy to help address the patient's anasarca 3.  I have ordered MiraLAX to help gently push the stool through the colon 4.  I will consider doing a sigmoidoscopy in several days if the patient continues to have rectal bleeding, to try to better clarify whether there is hemorrhoidal or rectal irritation.  Cleotis Nipper, M.D. Pager [phone] If no answer or after 5 PM call [phone]

## 2019-05-21 LAB — CBC WITH DIFFERENTIAL/PLATELET
Abs Immature Granulocytes: 0.44 10*3/uL — ABNORMAL HIGH (ref 0.00–0.07)
Basophils Absolute: 0.1 10*3/uL (ref 0.0–0.1)
Basophils Relative: 0 %
Eosinophils Absolute: 0.1 10*3/uL (ref 0.0–0.5)
Eosinophils Relative: 0 %
HCT: 28.4 % — ABNORMAL LOW (ref 39.0–52.0)
Hemoglobin: 9.6 g/dL — ABNORMAL LOW (ref 13.0–17.0)
Immature Granulocytes: 2 %
Lymphocytes Relative: 6 %
Lymphs Abs: 1.2 10*3/uL (ref 0.7–4.0)
MCH: 30.2 pg (ref 26.0–34.0)
MCHC: 33.8 g/dL (ref 30.0–36.0)
MCV: 89.3 fL (ref 80.0–100.0)
Monocytes Absolute: 1.4 10*3/uL — ABNORMAL HIGH (ref 0.1–1.0)
Monocytes Relative: 7 %
Neutro Abs: 16.6 10*3/uL — ABNORMAL HIGH (ref 1.7–7.7)
Neutrophils Relative %: 85 %
Platelets: 299 10*3/uL (ref 150–400)
RBC: 3.18 MIL/uL — ABNORMAL LOW (ref 4.22–5.81)
RDW: 13.6 % (ref 11.5–15.5)
WBC: 19.8 10*3/uL — ABNORMAL HIGH (ref 4.0–10.5)
nRBC: 0 % (ref 0.0–0.2)

## 2019-05-21 LAB — GLUCOSE, CAPILLARY
Glucose-Capillary: 129 mg/dL — ABNORMAL HIGH (ref 70–99)
Glucose-Capillary: 110 mg/dL — ABNORMAL HIGH (ref 70–99)
Glucose-Capillary: 153 mg/dL — ABNORMAL HIGH (ref 70–99)

## 2019-05-21 MED ORDER — OMEPRAZOLE 40 MG PO CPDR: 40.0000 mg | DELAYED_RELEASE_CAPSULE | Freq: Two times a day (BID) | ORAL | 0 refills | Status: DC

## 2019-05-21 MED ORDER — APIXABAN 5 MG PO TABS: 5.0000 mg | ORAL_TABLET | Freq: Two times a day (BID) | ORAL | 0 refills | Status: DC

## 2019-05-21 MED ORDER — SACCHAROMYCES BOULARDII 250 MG PO CAPS: 250.0000 mg | ORAL_CAPSULE | Freq: Two times a day (BID) | ORAL | 0 refills | Status: DC

## 2019-05-21 MED ORDER — ADULT MULTIVITAMIN W/MINERALS CH: 1.0000 | ORAL_TABLET | Freq: Every day | ORAL | 0 refills | Status: DC

## 2019-05-21 MED ORDER — ADULT MULTIVITAMIN W/MINERALS CH
1.0000 | ORAL_TABLET | Freq: Every day | ORAL | Status: DC
  Administered 2019-05-21 – 2019-05-22 (×2): 1 via ORAL
  Filled 2019-05-21 (×2): qty 1

## 2019-05-21 MED ORDER — BOOST / RESOURCE BREEZE PO LIQD CUSTOM
1.0000 | Freq: Three times a day (TID) | ORAL | Status: DC
  Administered 2019-05-21 – 2019-05-22 (×3): 1 via ORAL

## 2019-05-21 MED ORDER — FUROSEMIDE 10 MG/ML IJ SOLN
20.0000 mg | Freq: Two times a day (BID) | INTRAMUSCULAR | Status: DC
  Administered 2019-05-21 – 2019-05-22 (×3): 20 mg via INTRAVENOUS
  Filled 2019-05-21 (×3): qty 2

## 2019-05-21 MED ORDER — APIXABAN 5 MG PO TABS: 10.0000 mg | ORAL_TABLET | Freq: Two times a day (BID) | ORAL | 0 refills | Status: DC

## 2019-05-21 MED ORDER — POLYETHYLENE GLYCOL 3350 17 G PO PACK: 17.0000 g | PACK | Freq: Every day | ORAL | 0 refills | Status: DC

## 2019-05-21 MED ORDER — POTASSIUM CHLORIDE ER 10 MEQ PO TBCR: 10.0000 meq | EXTENDED_RELEASE_TABLET | Freq: Every day | ORAL | 0 refills | Status: DC

## 2019-05-21 MED ORDER — POTASSIUM CHLORIDE CRYS ER 20 MEQ PO TBCR
40.0000 meq | EXTENDED_RELEASE_TABLET | Freq: Once | ORAL | Status: AC
  Administered 2019-05-21: 40 meq via ORAL
  Filled 2019-05-21: qty 2

## 2019-05-21 MED ORDER — FUROSEMIDE 20 MG PO TABS: 20.0000 mg | ORAL_TABLET | Freq: Two times a day (BID) | ORAL | 0 refills | Status: DC

## 2019-05-21 MED ORDER — LOPERAMIDE HCL 2 MG PO CAPS: 2.0000 mg | ORAL_CAPSULE | ORAL | 0 refills | Status: DC | PRN

## 2019-05-21 NOTE — TOC Transition Note (Signed)
Transition of Care [facility]) - CM/SW Discharge Note   Patient Details  Name: Mark Kidd MRN: [id number] Date of Birth: Apr 03, 1956  Transition of [facility]) CM/SW Contact:  Bartholomew Crews, RN Phone Number: [phone] 05/21/2019, 4:02 PM   Clinical Narrative:    Spoke with patient at the bedside. PTA home with spouse. States his daughter lives in [place], and he hasn't told her that he is in the hospital stating he wants to wait until he feels better. Discussed new medication Eliquis - provided 30 day card to take to pharmacy. No HH or DME needs. Orders written for transition home today.   Final next level of care: Home/Self Care Barriers to Discharge: No Barriers Identified   Patient Goals and CMS Choice Patient states their goals for this hospitalization and ongoing recovery are:: to go home CMS Medicare.gov Compare Post Acute Care list provided to:: Patient Choice offered to / list presented to : NA  Discharge Placement                       Discharge Plan and Services                DME Arranged: N/A DME Agency: NA       HH Arranged: NA HH Agency: NA        Social Determinants of Health (SDOH) Interventions     Readmission Risk Interventions No flowsheet data found.

## 2019-05-21 NOTE — Progress Notes (Signed)
ANTICOAGULATION CONSULT NOTE - Follow Up Consult  Pharmacy Consult for Heparin, Apixaban Indication: pulmonary embolus  No Known Allergies  Patient Measurements: Height: 5\' 11"  (180.3 cm) Weight: 190 lb (86.2 kg) IBW/kg (Calculated) : 75.3  Vital Signs: Temp: 98 F (36.7 C) (09/07 0739) Temp Source: Oral (09/07 0739) BP: 147/85 (09/07 0739) Pulse Rate: 96 (09/07 0739)  Labs: Recent Labs    05/18/19 1757  05/19/19 0230 05/20/19 0234 05/21/19 0243  HGB  --    < > 11.7* 10.3* 9.6*  HCT  --   --  33.6* 29.9* 28.4*  PLT  --   --  218 243 299  HEPARINUNFRC 0.31  --   --   --   --   CREATININE 0.85  --  0.82 0.93  --    < > = values in this interval not displayed.    Estimated Creatinine Clearance: 87.7 mL/min (by C-G formula based on SCr of 0.93 mg/dL).  Assessment:  63 yr old man who presented on 8/25 with abdominal pain and concern for pancreatitis and possible perforated peptic ulcer. The patient found have acute small bilateral PE per CTA on 9/1 and pharmacy was consulted to start heparin for anticoagulation.   Pharmacy is consulted to transition from IV heparin infusion to PO apixaban. Heparin infusion discontinued 9/4, apixaban started 9/4.   Small amount of blood in stool prior to apixaban admistration 9/5. Hgb down to 9.6 from 10.3. GI aware, believe bleed is from perianal irritation, will monitor.  Goal of Therapy:  Monitor platelets by anticoagulation protocol: Yes   Plan:  Continue apixaban 10 mg PO BID, Day 4 of 7-day bridge, followed by 5 mg PO BID thereafter. Follow-up if patient would like TOC pharmacy to fill apixaban at discharge. CBCs every 3 days. Monitor for signs/symptoms of bleeding.   Khalin Royce L. Devin Going, PharmD, [place] PGY1 Pharmacy Resident 05/21/19      9:55 AM  Please check AMION for all [place] phone numbers After 10:00 PM, call the [place] [phone]

## 2019-05-21 NOTE — Progress Notes (Signed)
Initial Nutrition Assessment  RD working remotely.  DOCUMENTATION CODES:   Not applicable  INTERVENTION:   -MVI with minerals daily -Boost Breeze po TID, each supplement provides 250 kcal and 9 grams of protein  NUTRITION DIAGNOSIS:   Increased nutrient needs related to acute illness(pancreatitis) as evidenced by estimated needs.  GOAL:   Patient will meet greater than or equal to 90% of their needs  MONITOR:   PO intake, Supplement acceptance, Labs, Weight trends, Skin, I & O's  REASON FOR ASSESSMENT:   Consult Assessment of nutrition requirement/status, Calorie Count  ASSESSMENT:   Mark Kidd  is a 63 y.o. male, with history of diabetes mellitus type 2, hyperlipidemia, hypertension who came to hospital with epigastric pain which started last night.  Patient says that pain was very severe and kind of eased off around 1 AM.  In the morning though he felt better.  As soon as he ate food the pain returned after 1 hour.  He was also sweating and in extreme discomfort due to pain.  Patient says that he had a similar pain around August 10 at that time he was seen by his PCP and was prescribed omeprazole which did not help the pain  Pt admitted with acute pancreatitis.   8/26- s/p MRCP- revealed small pleural effusions with bilateral atelectasis  Reviewed I/O's: +1.8 L x 24 hours and +31.1 L since admission  Attempted to speak with pt via phone, however, no answer.   Per GI notes, pt with erratic bowel function. Pt has been experiencing mild blood in stool (possible due to perianal irritation) along with diarrhea (described as "clear squirts"). Pt also with anasarca. Pt may require sigmoidoscopy is rectal bleeding does not improve.   Per meal completion records, pt with 100% meal completion. However, chart review indicates that pt with poor appetite and early satiety due to abdominal distention.   No wt hx available to assess at this time.   Pt with increased nutritional needs  secondary to pancreatitis and would benefit from addition of nutritional supplements.   Medications reviewed and include florastor.   Lab Results  Component Value Date   HGBA1C 6.4 (H) 05/08/2019   PTA DM medications are 500 mg metformin q AM.   Labs reviewed: CBGS: 129-179 (inpatient orders for glycemic control are 0-9 units insulin aspart TID with meals).   Diet Order:   Diet Order            Diet Heart Room service appropriate? Yes; Fluid consistency: Thin  Diet effective now              EDUCATION NEEDS:   No education needs have been identified at this time  Skin:  Skin Assessment: Reviewed RN Assessment  Last BM:  05/21/19  Height:   Ht Readings from Last 1 Encounters:  05/07/19 5\' 11"  (1.803 m)    Weight:   Wt Readings from Last 1 Encounters:  05/07/19 86.2 kg    Ideal Body Weight:  78.2 kg  BMI:  Body mass index is 26.5 kg/m.  Estimated Nutritional Needs:   Kcal:  2150-2350  Protein:  105-120 grams  Fluid:  > 2.1 L    [name] A. [name], RD, LDN, [place] Registered Dietitian II Certified Diabetes Care and Education Specialist Pager: [phone] After hours Pager: [phone]

## 2019-05-21 NOTE — Progress Notes (Addendum)
Discussed pathophysiology of patient's condition with patient as well as recommendation for dietary intake and lowering fluid intake.  Patient had approximately 3 liters of fluid in room, which he states he easily consumes in one day.  Patient is on lasix for diuretic effect.  MD notified.  Will begin daily weights (standing) and strict intake.  Discussed possibility of backing off on fluid to below 2 liters per day.

## 2019-05-21 NOTE — Progress Notes (Signed)
The patient is without acute complaints.  He did receive his first dose of IV Lasix today and indicates that he had a prompt diuretic response.  In fact, he notes that his breathing seemed to be easier after receiving it.    The patient states that last night he had 2 well-formed bowel movements with normal, solid stool and a typical fecal odor, no visible blood.  This is the first time, he indicates, that he has had normal stool recently.  He has been off antibiotics several days.  Dietitian note reviewed; I agree that Boost supplements are a good idea.  It is noted that the patient's white count has gone back up to 19,800, but without a fever since I saw him yesterday, although he was borderline this afternoon at 99.8.  Exam: Sitting in his bedside chair as usual.  No pallor or icterus.  Chest is clear, no rales, no obvious pleural effusion.  Heart normal.  Abdomen moderately protuberant and somewhat firm but nontender.  Still mild puffiness of extremities consistent with anasarca.  Impression:  1.  Resolving pancreatitis of unclear cause 2.  Hypoalbuminemia with anasarca, received Lasix today 3.  Altered bowel function while on antibiotics, improved off antibiotics and on probiotics, on MiraLAX because of moderately large amount of formed stool in the proximal colon on KUB yesterday 4.  History of minimal hematochezia, resolved 5.  Persistent leukocytosis and smoldering low-grade fever, presumably attributable to the patient's post pancreatitis inflammatory changes within the abdomen  Plan: No change in management at the moment.  Mark Kidd, M.D. Pager [phone] If no answer or after 5 PM call [phone]

## 2019-05-22 LAB — GLUCOSE, CAPILLARY
Glucose-Capillary: 146 mg/dL — ABNORMAL HIGH (ref 70–99)
Glucose-Capillary: 169 mg/dL — ABNORMAL HIGH (ref 70–99)
Glucose-Capillary: 169 mg/dL — ABNORMAL HIGH (ref 70–99)
Glucose-Capillary: 182 mg/dL — ABNORMAL HIGH (ref 70–99)

## 2019-05-22 LAB — CBC
HCT: 28.2 % — ABNORMAL LOW (ref 39.0–52.0)
Hemoglobin: 9.6 g/dL — ABNORMAL LOW (ref 13.0–17.0)
MCH: 29.8 pg (ref 26.0–34.0)
MCHC: 34 g/dL (ref 30.0–36.0)
MCV: 87.6 fL (ref 80.0–100.0)
Platelets: 350 10*3/uL (ref 150–400)
RBC: 3.22 MIL/uL — ABNORMAL LOW (ref 4.22–5.81)
RDW: 13.3 % (ref 11.5–15.5)
WBC: 18.1 10*3/uL — ABNORMAL HIGH (ref 4.0–10.5)
nRBC: 0 % (ref 0.0–0.2)

## 2019-05-22 LAB — CULTURE, BLOOD (ROUTINE X 2)
Culture: NO GROWTH
Culture: NO GROWTH

## 2019-05-22 MED ORDER — APIXABAN 5 MG PO TABS: 10.0000 mg | ORAL_TABLET | Freq: Two times a day (BID) | ORAL | 0 refills | Status: DC

## 2019-05-22 NOTE — Progress Notes (Signed)
Patient being discharged today asked to remove telemetry we did. Waiting for pharmacy to organize required medication.

## 2019-05-22 NOTE — Progress Notes (Signed)
Patient has gotten all his belongings together and wife is coming to take him home. All evening medications were given paperwork given and one prescription. Ready for discharge home.

## 2019-05-22 NOTE — Discharge Summary (Signed)
Physician Discharge Summary  Omar Gayden ZOX:[id number] DOB: 1956/02/18 DOA: 05/07/2019  PCP: Burton Apley, MD  Admit date: 05/07/2019 Discharge date: 05/22/2019  Recommendations for Outpatient Follow-up:  1. Follow up with PCP in 7-10 days 2. Follow up with [facility] as directed 3. CBC to be drawn on 05/25/2019.  Discharge Diagnoses: Principal diagnosis is #1 1. Pancreatitis 2. Duodenal ulcer, Peptic ulcer 3. Bilateral pulmonary embolus 4. DM II  Discharge Condition: Fair Disposition: Home  Diet recommendation: carbohydrate controlled   Filed Weights   05/07/19 1533 05/22/19 0422  Weight: 86.2 kg 92 kg    History of present illness:  Mark Kidd  is a 63 y.o. male, with history of diabetes mellitus type 2, hyperlipidemia, hypertension who came to hospital with epigastric pain which started last night.  Patient says that pain was very severe and kind of eased off around 1 AM.  In the morning though he felt better.  As soon as he ate food the pain returned after 1 hour.  He was also sweating and in extreme discomfort due to pain.  Patient says that he had a similar pain around August 10 at that time he was seen by his PCP and was prescribed omeprazole which did not help the pain.  He had one episode of vomiting in the hospital. He denies vomiting any blood. Denies black stool. Denies blood in the stool. Denies previous history of gallstones. Denies chest pain or shortness of breath. Denies fever or chills  In the ED CT scan of the abdomen pelvis showed marked retroperitoneal inflammatory changes around second and third part of duodenum concerning for perforated peptic ulcer, also surrounding inflammation of pancreas.  Lipase significantly elevated to 6000, calcification noted at ampulla of greater concern for stone.  Both GI, Nandigam  and general surgery Dr Cliffton Asters were consulted at [facility] by Dr. Adriana Simas.  They both will see patient as consult when patient reaches [facility].  No  urgent surgery recommended as per conversation of Dr. Adriana Simas and Dr. Cliffton Asters.   Hospital Course:  The patient has been admitted to a medical bed. He has continued to have abdominal pain. He has been seen by Dr. Cliffton Asters and Dr. Randa Evens.  On 05/09/2019 the patient had improvement in lipase, but increase in bilirubin and WBC. MRCP demonstrated no biliary obstruction. It does demonstrated that the inflammed pancreas appears to impinge on the CBD resulting in narrowing at the distal end.  On the evening of 05/14/2019 a rapid response was called on the patient due to fever, tachypnea, and tachycardia.   CT abdomen and pelvix performed on 05/14/2019 demonstrates evolution of the pancreatitis with increased edema and inflammation.  CTA chest on 05/15/2019 was performed due to the patient's ongoing tachypnea, although he has had no hypoxia.  demonstrated small bilateral pulmonary emboli located in the lower lobes and the right middle lobe. I discussed the patient with Dr. Matthias Hughs as the CT abdomen and pelvis continued to suggest ulceration in the stomach and duodenum. Dr. Matthias Hughs stated that he was comfortably with the initiation of anticoagulation, but recommended the addition of sucralfate.   The patient had another episode of tachypnea and tachycardia in the setting of fevers on 05/15/2019. Blood cultures x 2 have been ordered. They have had no growth. The patient has continued to have intermittent fevers and WBC remains 20K. I have consulted infectious disease to help determine if there is a reason for concern or continued antibiotics. Blood cultures repeated. C Diff assay cancelled.  The patient tells me that he had blood dripping from his rectum after having a BM this morning. He stated that the BM was normal. I have discussed the patient with Dr. Matthias Hughs who advises that we continue the Eliquis and monitor hemoglobin. His hemoglobin dropped from 10.3 to 9.7. His bleeding has slowed down and he has been  cleared for discharge to home by Dr. Matthias Hughs  Today's assessment: S: The patient is sitting up at bedside. No new complaints. O: Vitals:  Vitals:   05/22/19 1000 05/22/19 1100  BP:    Pulse: (!) 107 (!) 107  Resp: (!) 25 (!) 32  Temp:    SpO2: 95% 96%    Constitutional:   The patient is resting comfortably. He has notable dyspnea with conversation. Respiratory:   Positive increased work of breathing with tachypnea.  No murmurs, ectopy or gallups.  No lateral PMI. No thrills. Cardiovascular:   Regular rate and rhythm  No murmurs, ectopy, or gallups  No lateral PMI. No thrills. Abdomen:   Abdomen is very distended and diffusely tender.  No hernias, masses, or organomegaly.  Positive for tympany with percussion. Musculoskeletal:   No cyanosis, clubbing, or edema Skin:   No rashes, lesions, ulcers  palpation of skin: no induration or nodules Neurologic:   CN 2-12 intact  Sensation all 4 extremities intact Psychiatric:   Mental status ? Mood, affect appropriate ? Orientation to person, place, time   judgment and insight appear intact     Discharge Instructions  Discharge Instructions    Activity as tolerated - No restrictions   Complete by: As directed    Call MD for:  persistant nausea and vomiting   Complete by: As directed    Call MD for:  severe uncontrolled pain   Complete by: As directed    Diet - low sodium heart healthy   Complete by: As directed    Discharge instructions   Complete by: As directed    Have CBC and Chemistry drawn on 05/25/2019. Report results to PCP. Follow up with PCP in 7-10 days. Follow up with [facility] as directed.   Increase activity slowly   Complete by: As directed      Allergies as of 05/22/2019   No Known Allergies     Medication List    TAKE these medications   apixaban 5 MG Tabs tablet Commonly known as: ELIQUIS Take 2 tablets (10 mg total) by mouth 2 (two) times daily. Start on Day 6.   apixaban 5  MG Tabs tablet Commonly known as: ELIQUIS Take 1 tablet (5 mg total) by mouth 2 (two) times daily. Start taking on: May 25, 2019   carvedilol 6.25 MG tablet Commonly known as: COREG Take 6.25 mg by mouth every evening.   furosemide 20 MG tablet Commonly known as: Lasix Take 1 tablet (20 mg total) by mouth 2 (two) times daily.   loperamide 2 MG capsule Commonly known as: IMODIUM Take 1 capsule (2 mg total) by mouth as needed for diarrhea or loose stools (after each loose BM).   metFORMIN 500 MG tablet Commonly known as: GLUCOPHAGE Take 500 mg by mouth every evening.   multivitamin with minerals Tabs tablet Take 1 tablet by mouth daily.   omeprazole 40 MG capsule Commonly known as: PRILOSEC Take 1 capsule (40 mg total) by mouth 2 (two) times daily before a meal.   polyethylene glycol 17 g packet Commonly known as: MIRALAX / GLYCOLAX Take 17 g by mouth daily.  potassium chloride 10 MEQ tablet Commonly known as: K-DUR Take 1 tablet (10 mEq total) by mouth daily. Take on days when you are taking lasix.   saccharomyces boulardii 250 MG capsule Commonly known as: FLORASTOR Take 1 capsule (250 mg total) by mouth 2 (two) times daily.   simvastatin 20 MG tablet Commonly known as: ZOCOR Take 20 mg by mouth every evening.      No Known Allergies  The results of significant diagnostics from this hospitalization (including imaging, microbiology, ancillary and laboratory) are listed below for reference.    Significant Diagnostic Studies: Ct Angio Chest Pe W Or Wo Contrast  Result Date: 05/15/2019 CLINICAL DATA:  Epigastric pain, leukocytosis, hypoxemia, question pulmonary embolism EXAM: CT ANGIOGRAPHY CHEST WITH CONTRAST TECHNIQUE: Multidetector CT imaging of the chest was performed using the standard protocol during bolus administration of intravenous contrast. Multiplanar CT image reconstructions and MIPs were obtained to evaluate the vascular anatomy. CONTRAST:  75mL  OMNIPAQUE IOHEXOL 350 MG/ML SOLN IV COMPARISON:  None Correlation earlier CT abdomen and pelvis 05/15/2019 FINDINGS: Cardiovascular: Aorta normal caliber without aneurysm or dissection. Minimal atherosclerotic calcification aortic arch. Heart appears mildly enlarged. No pericardial effusion. Pulmonary arteries adequately opacified. Small BILATERAL filling defects are seen within pulmonary arteries in the lower lobes and in the RIGHT middle lobe consistent with small pulmonary emboli. Mediastinum/Nodes: Base of cervical region normal appearance. Esophagus unremarkable. No thoracic adenopathy. Lungs/Pleura: Small BILATERAL pleural effusions with compressive atelectasis of the adjacent lower lobes. Remaining lungs clear. No infiltrate or pneumothorax. Upper Abdomen: Infiltrative changes are seen in the perigastric region in the upper abdomen extending to splenic hilum and gastrosplenic ligament, related to pancreatitis seen on prior CT abdomen exam. Musculoskeletal: No acute osseous findings. Review of the MIP images confirms the above findings. IMPRESSION: Small BILATERAL pulmonary emboli located in the lower lobes and RIGHT middle lobe. Small BILATERAL pleural effusions and compressive atelectasis of the posterior lungs. LEFT upper quadrant infiltrative changes consistent with changes of pancreatitis seen on prior CT. Aortic Atherosclerosis (ICD10-I70.0). Critical Value/emergent results were called by telephone at the time of interpretation on 05/15/2019 at 1:37 pm to Dr. Theodis SatoA. Adelena Desantiago, who verbally acknowledged these results. Electronically Signed   By: Ulyses SouthwardMark  Boles M.D.   On: 05/15/2019 13:37   Ct Abdomen Pelvis W Contrast  Result Date: 05/15/2019 CLINICAL DATA:  Sepsis. Follow-up pancreatitis. Rule out perforation. EXAM: CT ABDOMEN AND PELVIS WITH CONTRAST TECHNIQUE: Multidetector CT imaging of the abdomen and pelvis was performed using the standard protocol following bolus administration of intravenous contrast.  CONTRAST:  100mL OMNIPAQUE IOHEXOL 300 MG/ML  SOLN COMPARISON:  CT 05/07/2019. Abdominal MRI 05/10/2019 FINDINGS: Lower chest: Small pleural effusions with compressive atelectasis. Heart is normal in size. Hepatobiliary: No focal hepatic abnormality. Gallbladder physiologically distended. Mild gallbladder wall enhancement. No biliary dilatation. Pancreas: Progressive peripancreatic fat stranding and free fluid involving the entire pancreas since prior exam. No evidence of pancreatic non enhancement or necrosis. Free fluid tracking distal inferior to the pancreatic tail but no organized or peripherally enhancing peripancreatic collection. No pancreatic ductal dilatation. Spleen: Normal in size without focal abnormality. Adrenals/Urinary Tract: Normal adrenal glands. No hydronephrosis or perinephric edema. Homogeneous renal enhancement with symmetric excretion on delayed phase imaging. Urinary bladder is physiologically distended without wall thickening. Stomach/Bowel: Wall thickening of the distal stomach and duodenum. Questionable intramural air involving the pre pyloric stomach, best appreciated on series 6, image 41 and series 7, image 49. No extraluminal air or evidence perforation. Remaining small bowel is unremarkable.  Minimal colonic wall thickening at the splenic flexure and proximal descending colon, likely secondary to pancreatic inflammation. Normal appendix. Vascular/Lymphatic: Aortic atherosclerosis. No aneurysm. No evidence of portal or splenic vein thrombosis. No bulky abdominopelvic adenopathy. Reproductive: Prostate is unremarkable. Other: Progressive peripancreatic inflammation and free fluid since prior exam. Inflammatory change and edema tracks in the mesentery and bilateral pericolic gutters. Small amount of free fluid tracks into the pelvis. There is no free air. No organized focal fluid collection. Tiny fat containing umbilical hernia. Musculoskeletal: There are no acute or suspicious osseous  abnormalities. Degenerative change in the spine. IMPRESSION: 1. Progressive peripancreatic inflammation and free fluid consistent with acute pancreatitis. No evidence of pancreatic necrosis. No organized peripancreatic collection. Free fluid inflammatory changes track into the mesentery and both pericolic gutters. 2. Wall thickening of the distal stomach and duodenum with possible intramural air involving the pre pyloric stomach, suspicious for peptic ulcer. No extraluminal air or evidence of perforation. 3. Small pleural effusions with compressive atelectasis. Aortic Atherosclerosis (ICD10-I70.0). Electronically Signed   By: Narda Rutherford M.D.   On: 05/15/2019 04:00   Ct Abdomen Pelvis W Contrast  Result Date: 05/07/2019 CLINICAL DATA:  Abdominal pain, elevated LFTs, epigastric pain EXAM: CT ABDOMEN AND PELVIS WITH CONTRAST TECHNIQUE: Multidetector CT imaging of the abdomen and pelvis was performed using the standard protocol following bolus administration of intravenous contrast. CONTRAST:  OMNIPAQUE IOHEXOL 300 MG/ML  SOLN COMPARISON:  Abdominal ultrasound 10/15/2016 FINDINGS: Lower chest: Atelectatic changes are present in the lung bases. Normal heart size. No pericardial effusion. Hepatobiliary: No focal liver abnormality is seen. Small amount fluid within the gallbladder fossa. No frank gallbladder wall thickening. There is a punctate calcification near the level of the duodenum but this is unclear whether not this could reflect a distal CBD/ampullary stone. Pancreas: There are extensive retroperitoneal inflammatory changes however these do not appear focally centered upon the pancreas. The pancreas only appears only slightly edematous. No pancreatic ductal dilatation. Spleen: Normal in size without focal abnormality. Adrenals/Urinary Tract: Normal adrenal glands. Mild bilateral nonspecific perinephric stranding, a nonspecific finding though may correlate with either age or decreased renal  function. Kidneys are otherwise unremarkable, without renal calculi, suspicious lesion, or hydronephrosis. Bladder is unremarkable. Stomach/Bowel: Distal esophagus is normal. The gastric antrum appears slightly thickened. There is mucosal hyperenhancement circumferential thickening of the second and third portions of the duodenal sweep with a questionable focus of mural hypoattenuation (2/45) which may reflect a perforated duodenal ulcer. Remainder of the small bowel is unremarkable. A normal appendix is visualized. No colonic dilatation or wall thickening. Vascular/Lymphatic: Atherosclerotic plaque within the normal caliber aorta. Reactive adenopathy in the upper abdomen. No suspicious or enlarged lymph nodes in the included lymphatic chains. Reproductive: The prostate and seminal vesicles are unremarkable. Other: No abdominopelvic free fluid or free gas. No bowel containing hernias. Musculoskeletal: Multilevel degenerative changes are present in the imaged portions of the spine. Findings are maximal at L5-S1 with slight retrolisthesis. Features of Baastrup's disease. IMPRESSION: 1. Extensive retroperitoneal inflammatory changes appear largely centered upon the thickened and hyperenhancing second and third portions of the duodenal sweep with a questionable focus of mural hypoattenuation (2/45) which is highly suspicious for a perforated duodenal ulcer. 2. There are additional peripancreatic inflammatory features which could reflect a reactive inflammatory process from the adjacent duodenal inflammation or an intrinsic acute edematous pancreatitis. 3. Small amount of pericholecystic fluid without gallbladder wall thickening or visible intraluminal gallstones. A punctate calcification is seen in the duodenum near the  ampulla, unclear if this could reflect an ampullary stone. Could consider further evaluation with right upper quadrant ultrasound. 4. Mild bilateral nonspecific perinephric stranding, a nonspecific  finding though may correlate with either age or decreased renal function. 5. Aortic Atherosclerosis (ICD10-I70.0). Electronically Signed   By: Kreg Shropshire M.D.   On: 05/07/2019 20:53   Mr 3d Recon At Scanner  Result Date: 05/10/2019 CLINICAL DATA:  Inpatient. Abdominal pain, severe pancreatitis, hyperbilirubinemia. EXAM: MRI ABDOMEN WITHOUT AND WITH CONTRAST (INCLUDING MRCP) TECHNIQUE: Multiplanar multisequence MR imaging of the abdomen was performed both before and after the administration of intravenous contrast. Heavily T2-weighted images of the biliary and pancreatic ducts were obtained, and three-dimensional MRCP images were rendered by post processing. CONTRAST:  9 cc Gadavist IV. COMPARISON:  05/07/2019 CT abdomen/pelvis. 05/08/2019 abdominal sonogram. FINDINGS: Lower chest: Small dependent bilateral pleural effusions with moderate dependent bibasilar atelectasis. Hepatobiliary: Normal liver size and configuration. Mild diffuse hepatic steatosis. No liver mass. No cholelithiasis. Layering sludge in the gallbladder. Mild diffuse gallbladder wall thickening. No significant pericholecystic fluid. No biliary ductal dilatation. Common bile duct diameter 5 mm. Smooth narrowing of the intrapancreatic portion of the distal common bile duct. No filling defects within the biliary tree to suggest choledocholithiasis. No discrete biliary stricture. No biliary beading or masses. Pancreas: There is mild diffuse pancreatic and peripancreatic edema, compatible with acute pancreatitis. Retroperitoneal edema extends into anterior paranephric spaces bilaterally, left greater than right. Preserved pancreatic parenchymal enhancement. No pancreatic mass or duct dilation. No pancreas divisum. No peripancreatic fluid collections. Spleen: Normal size. No mass. Adrenals/Urinary Tract: Normal adrenals. No hydronephrosis. Normal kidneys with no renal mass. Stomach/Bowel: Normal non-distended stomach. Visualized small and large  bowel is normal caliber, with no bowel wall thickening. Vascular/Lymphatic: Normal caliber abdominal aorta. Patent portal, splenic, hepatic and renal veins. No pathologically enlarged lymph nodes in the abdomen. Other: Trace perihepatic and perisplenic ascites. No focal fluid collections. Musculoskeletal: No aggressive appearing focal osseous lesions. IMPRESSION: 1. Acute uncomplicated non necrotizing pancreatitis. 2. No cholelithiasis. No biliary ductal dilatation. No choledocholithiasis. Smooth extrinsic narrowing of the distal CBD by the inflamed pancreas. 3. Small bilateral pleural effusions with bibasilar atelectasis. 4. Trace perihepatic and perisplenic ascites. Electronically Signed   By: Delbert Phenix M.D.   On: 05/10/2019 11:20   Dg Chest Port 1 View  Result Date: 05/14/2019 CLINICAL DATA:  Shortness of breath EXAM: PORTABLE CHEST 1 VIEW COMPARISON:  None. FINDINGS: There is a small left pleural effusion with underlying atelectasis. The heart, hila, mediastinum, lungs, and pleura are otherwise unremarkable. IMPRESSION: No active disease. Electronically Signed   By: Gerome Sam III M.D   On: 05/14/2019 11:27   Dg Abd 2 Views  Result Date: 05/20/2019 CLINICAL DATA:  Abdominal distention.  Pancreatitis. EXAM: ABDOMEN - 2 VIEW COMPARISON:  05/14/2019 FINDINGS: Mild gaseous distension of the stomach. Left pleural effusion noted. The bowel gas pattern is normal. There is no evidence of free air. No radio-opaque calculi or other significant radiographic abnormality is seen. IMPRESSION: 1. Nonobstructive bowel gas pattern. 2. Mild gaseous distension of the stomach Electronically Signed   By: Signa Kell M.D.   On: 05/20/2019 16:21   Dg Abd Portable 1v  Result Date: 05/14/2019 CLINICAL DATA:  Abdominal distension.  Known pancreatitis. EXAM: PORTABLE ABDOMEN - 1 VIEW COMPARISON:  CT of the abdomen and pelvis 05/07/2019 MRI abdomen 05/10/2019 FINDINGS: The bowel gas pattern is normal. No radio-opaque  calculi or other significant radiographic abnormality are seen. IMPRESSION: Negative. Electronically Signed  By: Marin Roberts M.D.   On: 05/14/2019 07:33   Mr Abdomen Mrcp Vivien Rossetti Contast  Result Date: 05/10/2019 CLINICAL DATA:  Inpatient. Abdominal pain, severe pancreatitis, hyperbilirubinemia. EXAM: MRI ABDOMEN WITHOUT AND WITH CONTRAST (INCLUDING MRCP) TECHNIQUE: Multiplanar multisequence MR imaging of the abdomen was performed both before and after the administration of intravenous contrast. Heavily T2-weighted images of the biliary and pancreatic ducts were obtained, and three-dimensional MRCP images were rendered by post processing. CONTRAST:  9 cc Gadavist IV. COMPARISON:  05/07/2019 CT abdomen/pelvis. 05/08/2019 abdominal sonogram. FINDINGS: Lower chest: Small dependent bilateral pleural effusions with moderate dependent bibasilar atelectasis. Hepatobiliary: Normal liver size and configuration. Mild diffuse hepatic steatosis. No liver mass. No cholelithiasis. Layering sludge in the gallbladder. Mild diffuse gallbladder wall thickening. No significant pericholecystic fluid. No biliary ductal dilatation. Common bile duct diameter 5 mm. Smooth narrowing of the intrapancreatic portion of the distal common bile duct. No filling defects within the biliary tree to suggest choledocholithiasis. No discrete biliary stricture. No biliary beading or masses. Pancreas: There is mild diffuse pancreatic and peripancreatic edema, compatible with acute pancreatitis. Retroperitoneal edema extends into anterior paranephric spaces bilaterally, left greater than right. Preserved pancreatic parenchymal enhancement. No pancreatic mass or duct dilation. No pancreas divisum. No peripancreatic fluid collections. Spleen: Normal size. No mass. Adrenals/Urinary Tract: Normal adrenals. No hydronephrosis. Normal kidneys with no renal mass. Stomach/Bowel: Normal non-distended stomach. Visualized small and large bowel is normal  caliber, with no bowel wall thickening. Vascular/Lymphatic: Normal caliber abdominal aorta. Patent portal, splenic, hepatic and renal veins. No pathologically enlarged lymph nodes in the abdomen. Other: Trace perihepatic and perisplenic ascites. No focal fluid collections. Musculoskeletal: No aggressive appearing focal osseous lesions. IMPRESSION: 1. Acute uncomplicated non necrotizing pancreatitis. 2. No cholelithiasis. No biliary ductal dilatation. No choledocholithiasis. Smooth extrinsic narrowing of the distal CBD by the inflamed pancreas. 3. Small bilateral pleural effusions with bibasilar atelectasis. 4. Trace perihepatic and perisplenic ascites. Electronically Signed   By: Delbert Phenix M.D.   On: 05/10/2019 11:20   US Abdomen Limited Ruq  Result Date: 05/08/2019 CLINICAL DATA:  Right upper quadrant pain. EXAM: ULTRASOUND ABDOMEN LIMITED RIGHT UPPER QUADRANT COMPARISON:  10/15/2016 and abdominal CT 05/07/2019 FINDINGS: Gallbladder: No gallstones or wall thickening visualized. No sonographic Murphy sign noted by sonographer. Common bile duct: Diameter: 0.5 cm Liver: Increased echogenicity with mild heterogeneity. No focal lesion. No intrahepatic biliary dilatation. Portal vein is patent on color Doppler imaging with normal direction of blood flow towards the liver. Other: Trace fluid around the liver. IMPRESSION: 1. Normal appearance of the gallbladder without stones. No biliary dilatation. 2. Liver is echogenic and slightly heterogeneous. Trace perihepatic fluid is similar to the recent CT findings. Electronically Signed   By: Richarda Overlie M.D.   On: 05/08/2019 07:49    Microbiology: Recent Results (from the past 240 hour(s))  Gastrointestinal Panel by PCR , Stool     Status: None   Collection Time: 05/14/19 10:33 AM   Specimen: Stool  Result Value Ref Range Status   Campylobacter species NOT DETECTED NOT DETECTED Final   Plesimonas shigelloides NOT DETECTED NOT DETECTED Final   Salmonella species  NOT DETECTED NOT DETECTED Final   Yersinia enterocolitica NOT DETECTED NOT DETECTED Final   Vibrio species NOT DETECTED NOT DETECTED Final   Vibrio cholerae NOT DETECTED NOT DETECTED Final   Enteroaggregative E coli (EAEC) NOT DETECTED NOT DETECTED Final   Enteropathogenic E coli (EPEC) NOT DETECTED NOT DETECTED Final   Enterotoxigenic E coli (ETEC)  NOT DETECTED NOT DETECTED Final   Shiga like toxin producing E coli (STEC) NOT DETECTED NOT DETECTED Final   Shigella/Enteroinvasive E coli (EIEC) NOT DETECTED NOT DETECTED Final   Cryptosporidium NOT DETECTED NOT DETECTED Final   Cyclospora cayetanensis NOT DETECTED NOT DETECTED Final   Entamoeba histolytica NOT DETECTED NOT DETECTED Final   Giardia lamblia NOT DETECTED NOT DETECTED Final   Adenovirus F40/41 NOT DETECTED NOT DETECTED Final   Astrovirus NOT DETECTED NOT DETECTED Final   Norovirus GI/GII NOT DETECTED NOT DETECTED Final   Rotavirus A NOT DETECTED NOT DETECTED Final   Sapovirus (I, II, IV, and V) NOT DETECTED NOT DETECTED Final    Comment: Performed at [facility], [address] [place], [place] [address]  OVA + PARASITE EXAM     Status: None   Collection Time: 05/14/19 10:33 AM   Specimen: Stool  Result Value Ref Range Status   OVA + PARASITE EXAM Final report  Final    Comment: (NOTE) These results were obtained using wet preparation(s) and trichrome stained smear. This test does not include testing for Cryptosporidium parvum, Cyclospora, or Microsporidia. Performed At: [facility] [place] [id number] [place], [place] [id number] Revonda Standardiley Celeste R MD WG:[phone]    Source of Sample STOOL  Final    Comment: Performed at [facility], [address] [place], [place] [address]  C difficile quick scan w PCR reflex     Status: None   Collection Time: 05/14/19 10:33 AM   Specimen: Stool  Result Value Ref Range Status   C Diff antigen NEGATIVE NEGATIVE Final   C Diff toxin NEGATIVE NEGATIVE Final   C  Diff interpretation No C. difficile detected.  Final    Comment: Performed at [facility], [address] [place], [place] [address]  Culture, blood (routine x 2)     Status: None   Collection Time: 05/14/19 11:07 AM   Specimen: BLOOD  Result Value Ref Range Status   Specimen Description BLOOD SITE NOT SPECIFIED  Final   Special Requests   Final    BOTTLES DRAWN AEROBIC AND ANAEROBIC Blood Culture adequate volume   Culture   Final    NO GROWTH 5 DAYS Performed at [facility], [address] [place], [place] [address]    Report Status 05/19/2019 FINAL  Final  Culture, blood (routine x 2)     Status: None   Collection Time: 05/14/19 12:37 PM   Specimen: BLOOD  Result Value Ref Range Status   Specimen Description BLOOD RIGHT ANTECUBITAL  Final   Special Requests   Final    BOTTLES DRAWN AEROBIC ONLY Blood Culture adequate volume   Culture   Final    NO GROWTH 5 DAYS Performed at [facility], [address] [place], [place] [address]    Report Status 05/19/2019 FINAL  Final  MRSA PCR Screening     Status: None   Collection Time: 05/14/19 10:49 PM   Specimen: Nasal Mucosa; Nasopharyngeal  Result Value Ref Range Status   MRSA by PCR NEGATIVE NEGATIVE Final    Comment:        The GeneXpert MRSA Assay (FDA approved for NASAL specimens only), is one component of a comprehensive MRSA colonization surveillance program. It is not intended to diagnose MRSA infection nor to guide or monitor treatment for MRSA infections. Performed at [facility], [address] [place], [place] [address]   Culture, Urine  Status: None   Collection Time: 05/14/19 11:26 PM   Specimen: Urine, Clean Catch  Result Value Ref Range Status   Specimen Description URINE, CLEAN CATCH  Final   Special Requests NONE  Final   Culture   Final    NO GROWTH Performed at [facility], [address] [place], [place] [address]    Report Status 05/16/2019 FINAL  Final    Culture, blood (routine x 2)     Status: None   Collection Time: 05/17/19  6:21 PM   Specimen: BLOOD  Result Value Ref Range Status   Specimen Description BLOOD RIGHT ANTECUBITAL  Final   Special Requests   Final    BOTTLES DRAWN AEROBIC AND ANAEROBIC Blood Culture results may not be optimal due to an inadequate volume of blood received in culture bottles   Culture   Final    NO GROWTH 5 DAYS Performed at [facility], [address], [place], [place] [address]    Report Status 05/22/2019 FINAL  Final  Culture, blood (routine x 2)     Status: None   Collection Time: 05/17/19  6:24 PM   Specimen: BLOOD RIGHT HAND  Result Value Ref Range Status   Specimen Description BLOOD RIGHT HAND  Final   Special Requests   Final    BOTTLES DRAWN AEROBIC AND ANAEROBIC Blood Culture results may not be optimal due to an inadequate volume of blood received in culture bottles   Culture   Final    NO GROWTH 5 DAYS Performed at [facility], [address], [place], [place] [address]    Report Status 05/22/2019 FINAL  Final     Labs: Basic Metabolic Panel: Recent Labs  Lab 05/16/19 0725 05/18/19 0238 05/18/19 1757 05/19/19 0230 05/20/19 0234  NA 136 135 135 133* 135  K 3.1* 2.8* 2.8* 3.4* 3.2*  CL 98 103 99 99 101  CO2 27 24 27 24 25   GLUCOSE 103* 187* 166* 124* 132*  BUN 8 6* 5* 6* 12  CREATININE 0.98 0.78 0.85 0.82 0.93  CALCIUM 7.3* 7.0* 7.2* 7.3* 7.4*  MG  --  2.0  --   --   --    Liver Function Tests: Recent Labs  Lab 05/16/19 0725 05/18/19 0238 05/19/19 0230 05/20/19 0234  AST 40 59* 48* 37  ALT 54* 52* 46* 38  ALKPHOS 108 107 107 89  BILITOT 0.7 1.1 1.3* 1.0  PROT 6.0* 5.0* 5.5* 5.4*  ALBUMIN 2.0* 1.7* 1.8* 1.7*   Recent Labs  Lab 05/16/19 0725  LIPASE 25   No results for input(s): AMMONIA in the last 168 hours. CBC: Recent Labs  Lab 05/16/19 0725  05/18/19 0238 05/19/19 0230 05/20/19 0234 05/21/19 0243 05/22/19 0257  WBC 23.7*   < > 19.6*  19.8* 17.9* 19.8* 18.1*  NEUTROABS 20.7*  --  16.9* 16.9*  --  16.6*  --   HGB 13.1   < > 11.6* 11.7* 10.3* 9.6* 9.6*  HCT 38.5*   < > 33.6* 33.6* 29.9* 28.4* 28.2*  MCV 88.5   < > 87.5 87.0 87.4 89.3 87.6  PLT 224   < > 190 218 243 299 350   < > = values in this interval not displayed.   Cardiac Enzymes: No results for input(s): CKTOTAL, CKMB, CKMBINDEX, TROPONINI in the last 168 hours. BNP: BNP (last 3 results) Recent Labs    05/14/19 1107  BNP 170.5*    ProBNP (last 3  results) No results for input(s): PROBNP in the last 8760 hours.  CBG: Recent Labs  Lab 05/21/19 1218 05/21/19 1628 05/22/19 0026 05/22/19 0753 05/22/19 1142  GLUCAP 110* 153* 169* 169* 182*    Principal Problem:   Pancreatitis Active Problems:   Duodenal ulcer   DM II (diabetes mellitus, type II), controlled ([place])   Essential hypertension   Time coordinating discharge: 38 minutes  Signed:        Tennile Styles, DO [facility]  05/22/2019, 7:06 PM

## 2019-05-22 NOTE — Progress Notes (Signed)
T-max 99.9 past 24 hours.  White count minimally improved, from 19,000 to 18,000.  Plan for dischg (per pt) noted.  Chest is clear, heart nl, abd w/ BS's but still protruberant and somewhat firm.  Pt feels better getting rid of fluid w/ Lasix:  Legs and arms less tight, abd less tight.  PLAN:  Pt will call our office to arr f/u visit, probably w/ Dr. Michail Sermon who is his primary GI doc.   I would recomm that he be seen in approx 2-3 wks.   Mark Kidd, M.D. Pager [phone] If no answer or after 5 PM call [phone]

## 2019-05-22 NOTE — Progress Notes (Signed)
PROGRESS NOTE  [name] GEZ:[id number] DOB: 02-19-1956 DOA: 05/07/2019 PCP: Lorene Dy, MD  Brief History   Mark Kidd  is a 63 y.o. male, with history of diabetes mellitus type 2, hyperlipidemia, hypertension who came to hospital with epigastric pain which started last night.  Patient says that pain was very severe and kind of eased off around 1 AM.  In the morning though he felt better.  As soon as he ate food the pain returned after 1 hour.  He was also sweating and in extreme discomfort due to pain.  Patient says that he had a similar pain around August 10 at that time he was seen by his PCP and was prescribed omeprazole which did not help the pain.  He had one episode of vomiting in the hospital. He denies vomiting any blood. Denies black stool. Denies blood in the stool. Denies previous history of gallstones. Denies chest pain or shortness of breath. Denies fever or chills.  In the ED CT scan of the abdomen pelvis showed marked retroperitoneal inflammatory changes around second and third part of duodenum concerning for perforated peptic ulcer, also surrounding inflammation of pancreas.  Lipase significantly elevated to 6000, calcification noted at ampulla of greater concern for stone.  Both GI, Nandigam  and general surgery Dr Dema Severin were consulted at [facility] by Dr. Lacinda Axon.  They both will see patient as consult when patient reaches [facility].  No urgent surgery recommended as per conversation of Dr. Lacinda Axon and Dr. Dema Severin.  The patient has been admitted to a medical bed. He has continued to have abdominal pain. He has been seen by Dr. Dema Severin and Dr. Oletta Lamas.  On 05/09/2019 the patient had improvement in lipase, but increase in bilirubin and WBC. MRCP demonstrated no biliary obstruction. It does demonstrated that the inflammed pancreas appears to impinge on the CBD resulting in narrowing at the distal end.  On the evening of 05/14/2019 a rapid response was called on the patient due to fever,  tachypnea, and tachycardia.   CT abdomen and pelvix performed on 05/14/2019 demonstrates evolution of the pancreatitis with increased edema and inflammation.  CTA chest on 05/15/2019 was performed due to the patient's ongoing tachypnea, although he has had no hypoxia.  demonstrated small bilateral pulmonary emboli located in the lower lobes and the right middle lobe. I discussed the patient with Dr. Cristina Gong as the CT abdomen and pelvis continued to suggest ulceration in the stomach and duodenum. Dr. Cristina Gong stated that he was comfortably with the initiation of anticoagulation, but recommended the addition of sucralfate.   The patient had another episode of tachypnea and tachycardia in the setting of fevers on 05/15/2019. Blood cultures x 2 have been ordered. They have had no growth. The patient has continued to have intermittent fevers and WBC remains 20K. I have consulted infectious disease to help determine if there is a reason for concern or continued antibiotics. Blood cultures repeated. C Diff assay cancelled.  The patient tells me that he had blood dripping from his rectum after having a BM this morning. He stated that the BM was normal. I have discussed the patient with Dr. Cristina Gong who advises that we continue the Eliquis and monitor hemoglobin. His hemoglobin dropped from 10.3 to 9.7.  Consultants   General Surgery  Gastroenterology  Infectious Disease  Procedures   MRCP  Antibiotics   Anti-infectives (From admission, onward)   Start     Dose/Rate Route Frequency Ordered Stop   05/14/19 1200  meropenem (  MERREM) 1 g in sodium chloride 0.9 % 100 mL IVPB  Status:  Discontinued     1 g 200 mL/hr over 30 Minutes Intravenous Every 8 hours 05/14/19 1115 05/18/19 0656   05/09/19 0930  cefTRIAXone (ROCEPHIN) 2 g in sodium chloride 0.9 % 100 mL IVPB  Status:  Discontinued     2 g 200 mL/hr over 30 Minutes Intravenous Every 24 hours 05/09/19 0926 05/14/19 1051     Subjective  The patient  is sitting up at bedside. No new complaints other than continued small amount of bleeding from rectum.  Objective   Vitals:  Vitals:   05/22/19 1000 05/22/19 1100  BP:    Pulse: (!) 107 (!) 107  Resp: (!) 25 (!) 32  Temp:    SpO2: 95% 96%    Exam:  Constitutional:   The patient is resting comfortably. He has notable dyspnea with conversation. Respiratory:   Positive increased work of breathing with tachypnea.  No murmurs, ectopy or gallups.  No lateral PMI. No thrills. Cardiovascular:   Regular rate and rhythm  No murmurs, ectopy, or gallups  No lateral PMI. No thrills. Abdomen:   Abdomen is very distended and diffusely tender.  No hernias, masses, or organomegaly.  Positive for tympany with percussion. Musculoskeletal:   No cyanosis, clubbing, or edema Skin:   No rashes, lesions, ulcers  palpation of skin: no induration or nodules Neurologic:   CN 2-12 intact  Sensation all 4 extremities intact Psychiatric:   Mental status o Mood, affect appropriate o Orientation to person, place, time   judgment and insight appear intact    I have personally reviewed the following:   Today's Data   Vitals, CBC, CMP  Scheduled Meds:  apixaban  10 mg Oral BID   Followed by   Derrill Memo ON 05/25/2019] apixaban  5 mg Oral BID   carvedilol  6.25 mg Oral BID WC   cycloSPORINE  1 drop Both Eyes BID   feeding supplement  1 Container Oral TID BM   furosemide  20 mg Intravenous Q12H   insulin aspart  0-9 Units Subcutaneous TID WC   multivitamin with minerals  1 tablet Oral Daily   omeprazole  40 mg Oral BID AC   polyethylene glycol  17 g Oral BID   saccharomyces boulardii  250 mg Oral BID   sucralfate  1 g Oral Q6H     No problems updated.   LOS: 15 days   A & P  Perforated duodenal ulcer-as per CT scan abdomen pelvis as above, patient is clinically stable.  Vitals are stable, does not have rigidity or guarding on abdominal examination.  General  surgery has been consulted by ED physician, Dr. Dema Severin will see patient when [facility].  We will keep him n.p.o., dilaudid, IV normal saline at 125 mill per hour. General surgery wishes to treat this problem conservatively with close monitoring.No bleeding ulcer per GI. Pt will need to follow up with GI as outpatient for biopsies of the duodenal lesion.  Bright Red Blood Per Rectum: Drips of blood from rectum following BM this morning. H&H stable. I have discussed the patient with Dr. Cristina Gong. He advises following hemoglobin and continuing Eliquis. He dropped his hemoglobin from 11.7 on 05/19/2019 to 10.3 today. Continue to monitor.  Tachypnea: DDx: pulmonary emboli, abdominal distention and upward pressure on diaphragm due to inflammation and edema related to pancreas. Will check abdominal x-ray. Consider pulmonology consult for tachypnea if continues. Abdominal  film demonstrated abdominal distention which is likely behind the patient's tachypnea. Will give the patient a suppositories and encourage the patient to walk to relieve gas.  Acute pancreatitis-lipase significantly elevated 6889, AST 514, ALT 378, alk phos 91, total bilirubin 1.8. CT abdomen also shows punctate calcification in the duodenum near the ampulla which could reflect an ampullary stone.  GI has been consulted.  GI has seen the patient.Pain control and IV fluids as above. Lipase is down to 25 on 05/15/2019. He is on a soft diet. Lactic acid is 1.2. There is still a significant amount of edema and inflammation about the pancreas. It is quite possible that the continued fevers and leukocytosis are just reactive from this ongoing process. Antibiotics have been discontinued. We will continue to monitor the patient. I appreciate Dr. Osborn Coho assistance.  Pulmonary emboli: Pt has been started on a heparin drip. Pt has been converted to Eliquis. Managed by pharmacy.  Leukocytosis: Increased. Possibly due to ongoing  inflammation in the pancreas or pulmonary embolus. The patient had another episode of tachypnea and tachycardia in the setting of fevers on 05/15/2019. Blood cultures x 2 have been ordered. They have had no growth. The patient has continued to have intermittent fevers and WBC remains 20K. I have consulted infectious disease to help determine if there is a reason for concern or continued antibiotics.There is still a significant amount of edema and inflammation about the pancreas. It is quite possible that the continued fevers and leukocytosis are just reactive from this ongoing process. Antibiotics have been discontinued. The leukocytosis and fevers may also be due to pulmonary emboli. We will monitor the patient off of antibiotics as per infectious disease.  Diarrhea: Not true diarrhea. Nor is it similar to C Diff colitis. The patient has some semi-liquid/solid stool with each trip to urinate. I had the privilege of seeing the patient with Dr. Cristina Gong. He has placed the patient on loperimide to help with this. 1 BM today.  Hyperbilirubinemia: Bilirubin is down to 2.1 from 9.0. WBC down to 11.8. MRCP demonstrated no obstruction with biliary stone, however it appears that the inflammed pancreas may be narrowing the distal end of the duct.  Diabetes mellitus type 2-we will initiate sliding scale insulin with NovoLog. Check CBG every 4 hours.  Metformin on hold.  Hypertension-blood pressure stable, patient takes Coreg 6.25 mg every evening.    I have seen and examined this patient myself. I have spent 32 minutes in his evaluation and care.  DVT Prophylaxis:  Lovenox  Family Communication: The patient's wife is at bedside. Code Status: Full code Disposition: Home  Azad Calame, DO [facility] Direct contact: see [email or web address]  7PM-7AM contact night coverage as above 05/21/2019, 5:23 PM  LOS: 1 day

## 2020-03-04 IMAGING — CT CT ABD-PELV W/ CM
2 of 4 series · 16 of 46 positions shown, 18 images · IV contrast (APPLIED)
Comparison: CT 05/07/2019. Abdominal MRI 05/10/2019

CLINICAL DATA: Sepsis. Follow-up pancreatitis. Rule out
perforation.

EXAM:
CT ABDOMEN AND PELVIS WITH CONTRAST
TECHNIQUE: Multidetector CT imaging of the abdomen and pelvis was performed
using the standard protocol following bolus administration of
intravenous contrast.
CONTRAST:  100mL OMNIPAQUE IOHEXOL 300 MG/ML  SOLN

[Series 3: abd/ pelvis 5.0 i30f 2 · axial · 0.90mm/px · z∈[+790,+1300]mm · 13 of 112 slices shown, 15 images]
[im 5/112  soft-tissue]
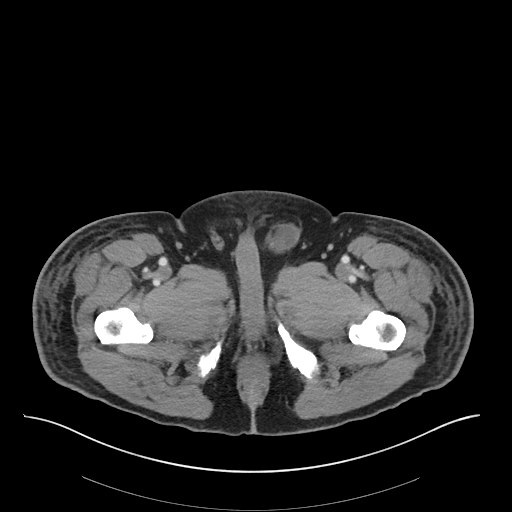
[im 5/112  bone]
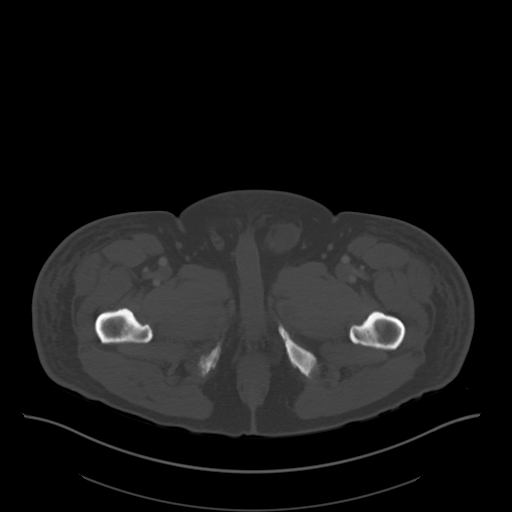
[im 14/112  soft-tissue]
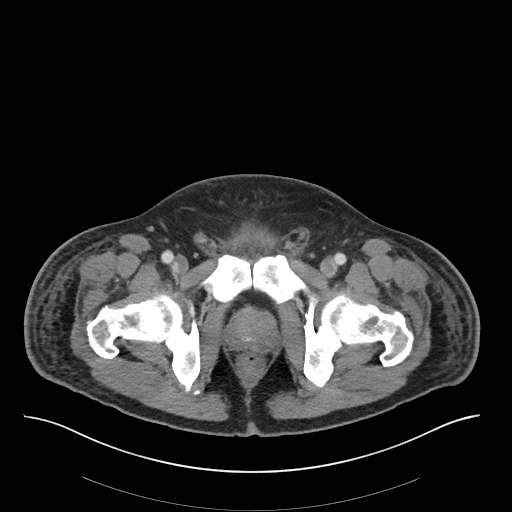
[im 24/112  soft-tissue]
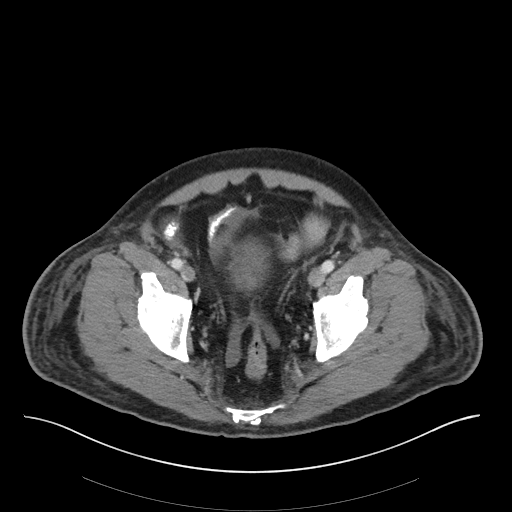
[im 33/112  soft-tissue]
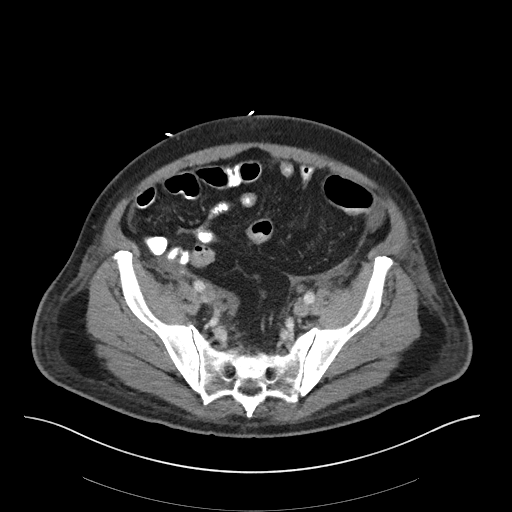
[im 38/112  soft-tissue]
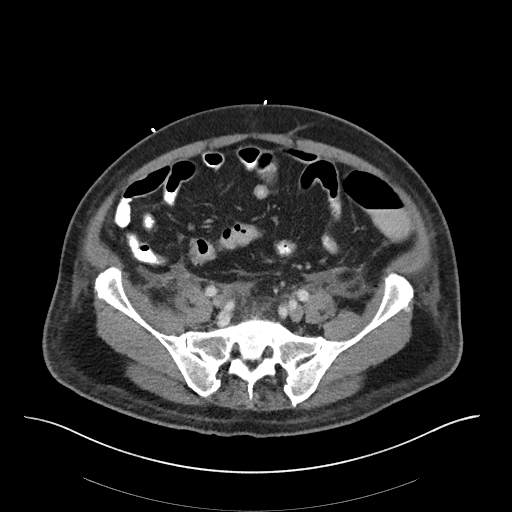
[im 47/112  soft-tissue]
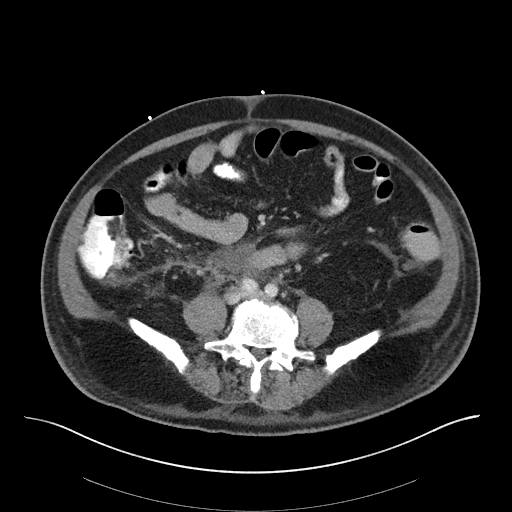
[im 56/112  soft-tissue]
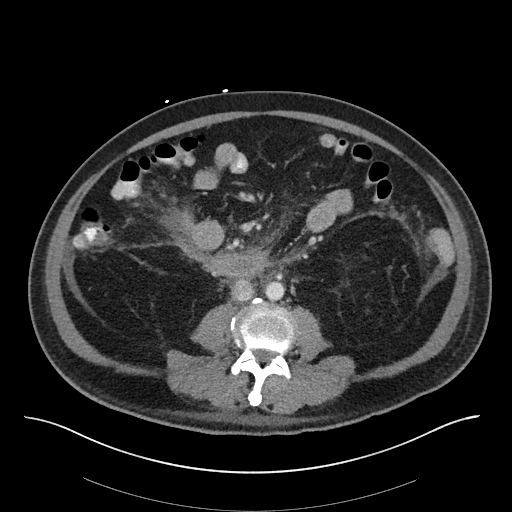
[im 65/112  soft-tissue]
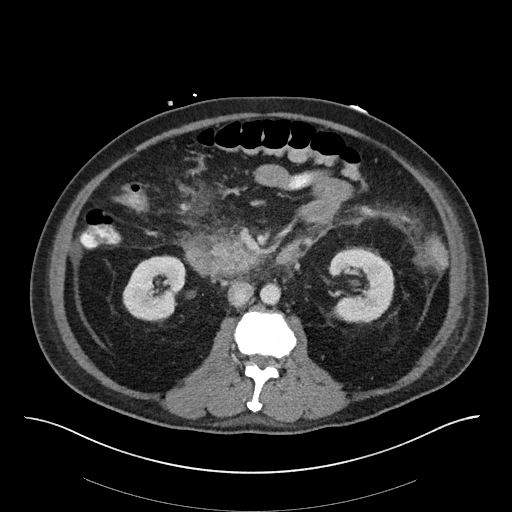
[im 75/112  soft-tissue]
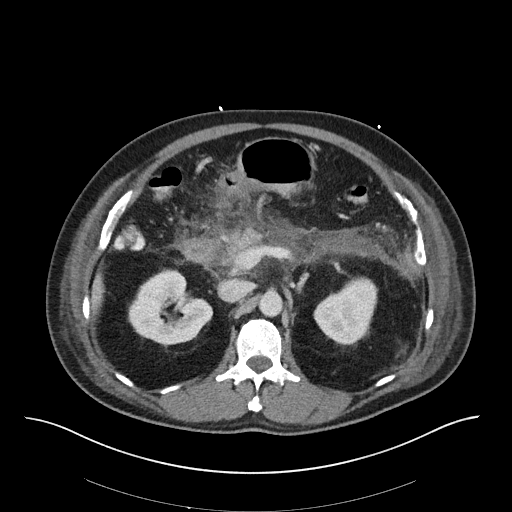
[im 75/112  bone]
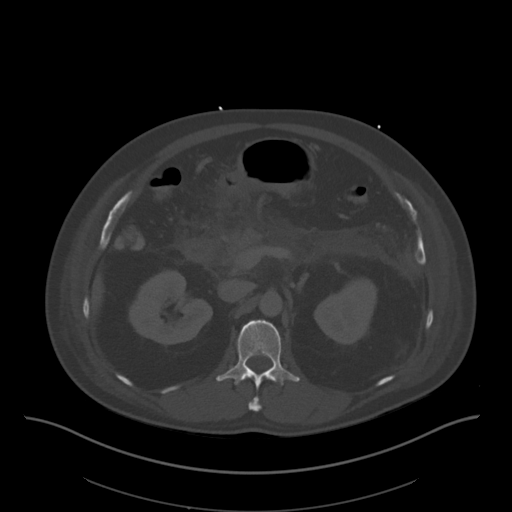
[im 79/112  soft-tissue]
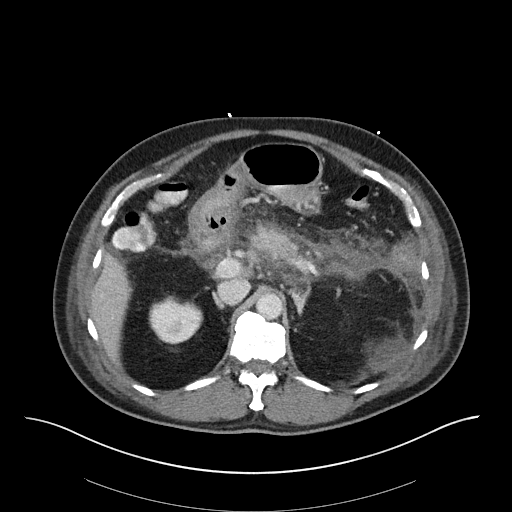
[im 88/112  soft-tissue]
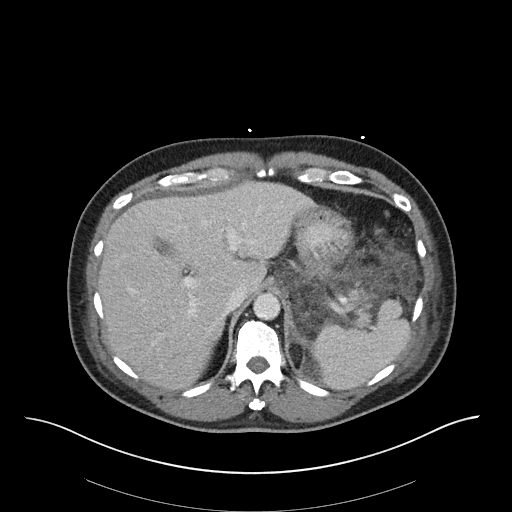
[im 98/112  soft-tissue]
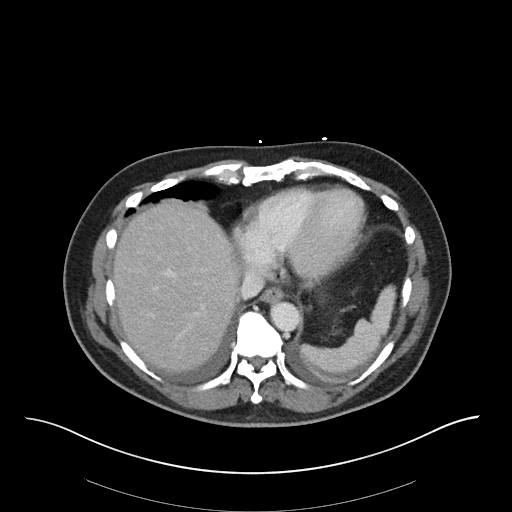
[im 107/112  soft-tissue]
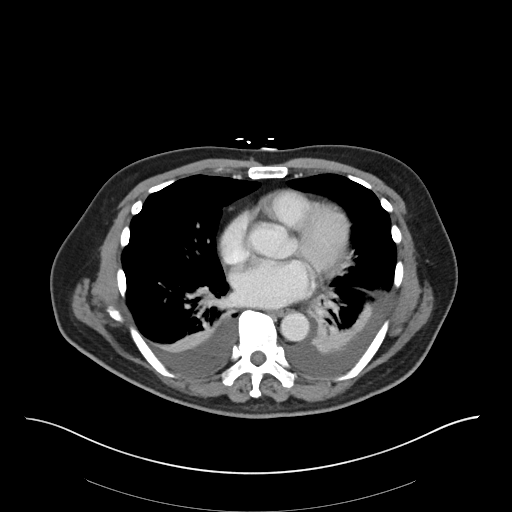

[Series 6: coronal soft tissue · coronal · 1.06mm/px · 3 of 101 slices shown]
[im 34/101  soft-tissue]
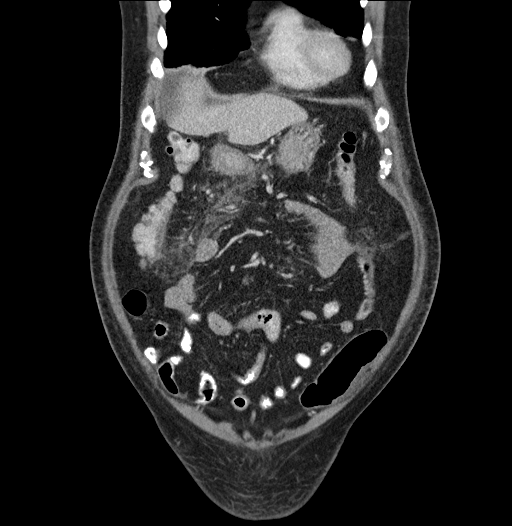
[im 45/101  soft-tissue]
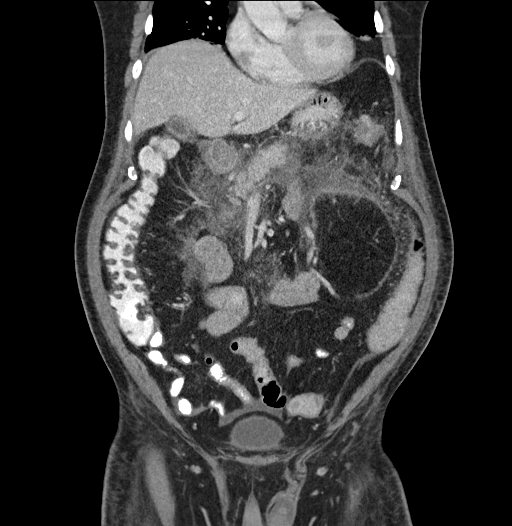
[im 56/101  soft-tissue]
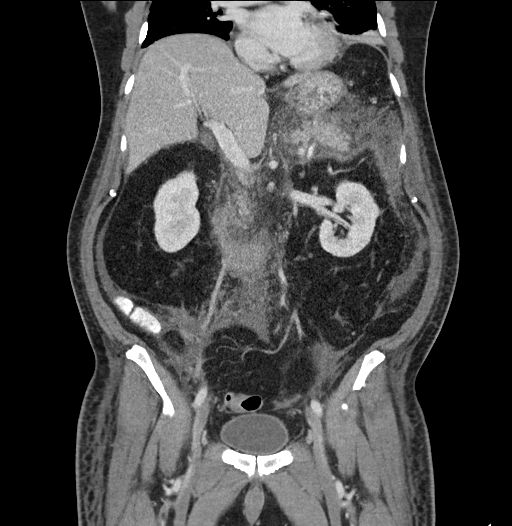

[16 of 46 positions shown; findings below may reference images not displayed]

FINDINGS: Lower chest: Small pleural effusions with compressive atelectasis.
Heart is normal in size.

Hepatobiliary: No focal hepatic abnormality. Gallbladder
physiologically distended. Mild gallbladder wall enhancement. No
biliary dilatation.

Pancreas: Progressive peripancreatic fat stranding and free fluid
involving the entire pancreas since prior exam. No evidence of
pancreatic non enhancement or necrosis. Free fluid tracking distal
inferior to the pancreatic tail but no organized or peripherally
enhancing peripancreatic collection. No pancreatic ductal
dilatation.

Spleen: Normal in size without focal abnormality.

Adrenals/Urinary Tract: Normal adrenal glands. No hydronephrosis or
perinephric edema. Homogeneous renal enhancement with symmetric
excretion on delayed phase imaging. Urinary bladder is
physiologically distended without wall thickening.

Stomach/Bowel: Wall thickening of the distal stomach and duodenum.
Questionable intramural air involving the pre pyloric stomach, best
appreciated on series 6, image 41 and series 7, image 49. No
extraluminal air or evidence perforation. Remaining small bowel is
unremarkable. Minimal colonic wall thickening at the splenic flexure
and proximal descending colon, likely secondary to pancreatic
inflammation. Normal appendix.

Vascular/Lymphatic: Aortic atherosclerosis. No aneurysm. No evidence
of portal or splenic vein thrombosis. No bulky abdominopelvic
adenopathy.

Reproductive: Prostate is unremarkable.

Other: Progressive peripancreatic inflammation and free fluid since
prior exam. Inflammatory change and edema tracks in the mesentery
and bilateral pericolic gutters. Small amount of free fluid tracks
into the pelvis. There is no free air. No organized focal fluid
collection. Tiny fat containing umbilical hernia.

Musculoskeletal: There are no acute or suspicious osseous
abnormalities. Degenerative change in the spine.
IMPRESSION: 1. Progressive peripancreatic inflammation and free fluid consistent
with acute pancreatitis. No evidence of pancreatic necrosis. No
organized peripancreatic collection. Free fluid inflammatory changes
track into the mesentery and both pericolic gutters.
2. Wall thickening of the distal stomach and duodenum with possible
intramural air involving the pre pyloric stomach, suspicious for
peptic ulcer. No extraluminal air or evidence of perforation.
3. Small pleural effusions with compressive atelectasis.

Aortic Atherosclerosis ([id number].G).

## 2020-03-04 IMAGING — CT CT ANGIO CHEST
2 of 6 series · 18 of 36 positions shown · IV contrast (omnipaque)
Comparison: None

Correlation earlier CT abdomen and pelvis 05/15/2019

CLINICAL DATA: Epigastric pain, leukocytosis, hypoxemia, question
pulmonary embolism

EXAM:
CT ANGIOGRAPHY CHEST WITH CONTRAST
TECHNIQUE: Multidetector CT imaging of the chest was performed using the
standard protocol during bolus administration of intravenous
contrast. Multiplanar CT image reconstructions and MIPs were
obtained to evaluate the vascular anatomy.
CONTRAST:  75mL OMNIPAQUE IOHEXOL 350 MG/ML SOLN IV

[Series 7: pe thins · axial · 0.73mm/px · z∈[+1213,+1469]mm · 17 of 407 slices shown]
[im 21/407  lung]
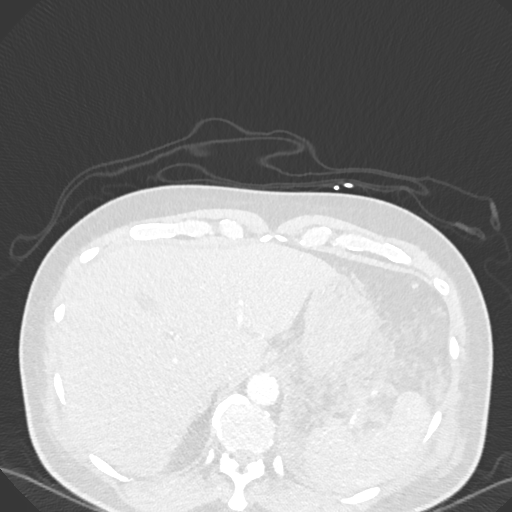
[im 41/407  mediastinal]
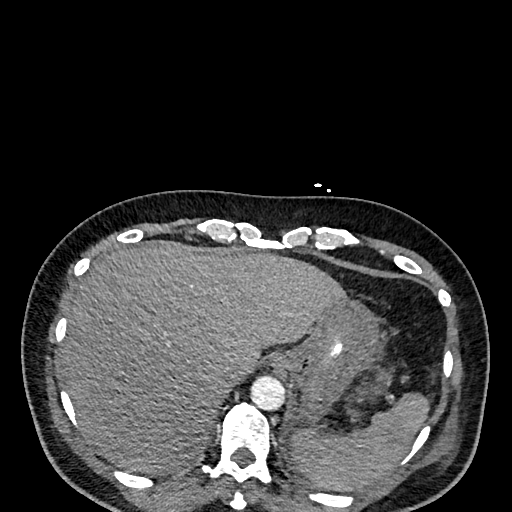
[im 61/407  lung]
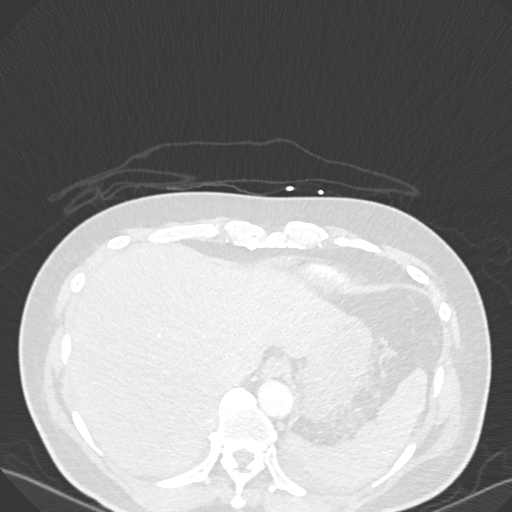
[im 82/407  mediastinal]
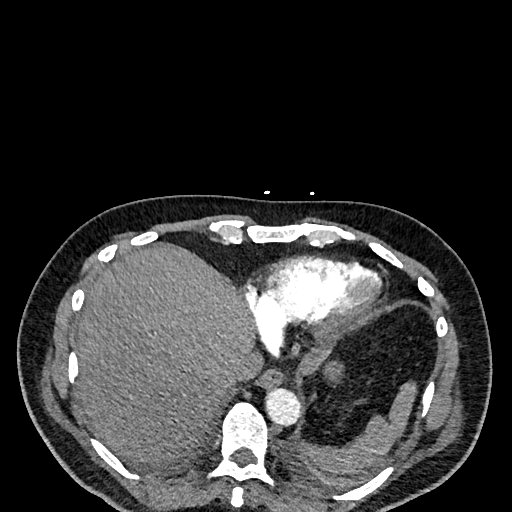
[im 122/407  lung]
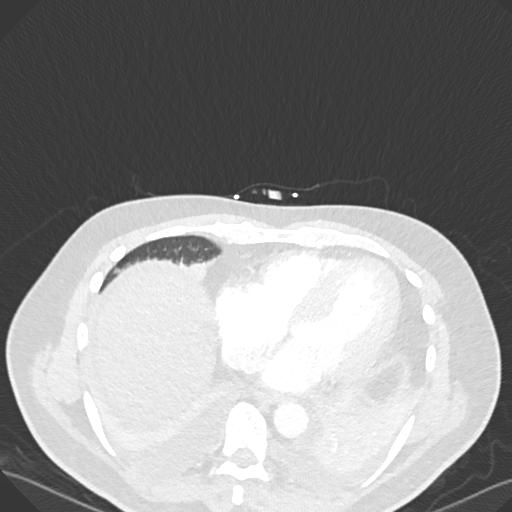
[im 143/407  mediastinal]
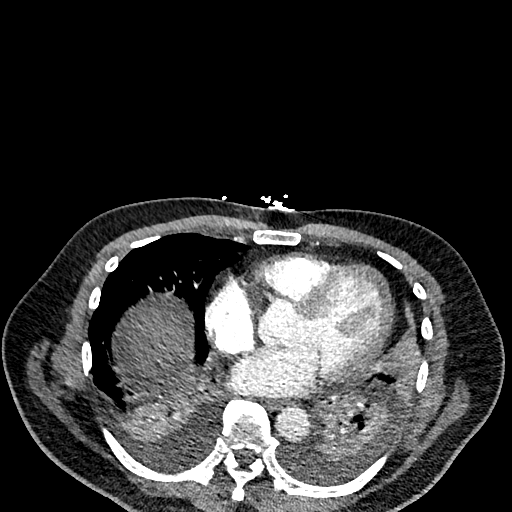
[im 163/407  lung]
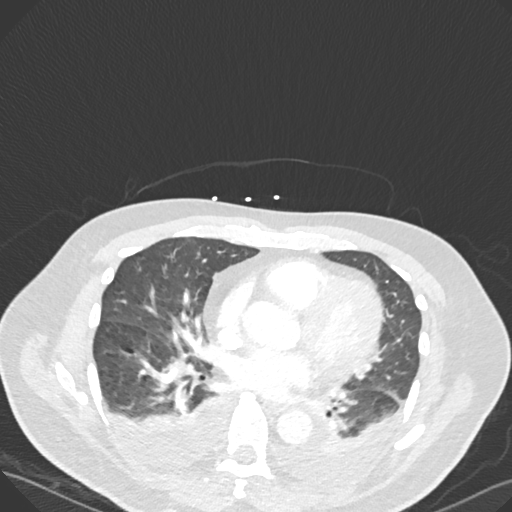
[im 183/407  mediastinal]
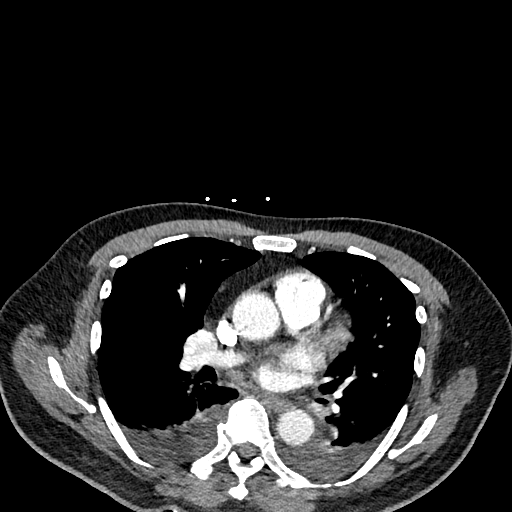
[im 204/407  lung]
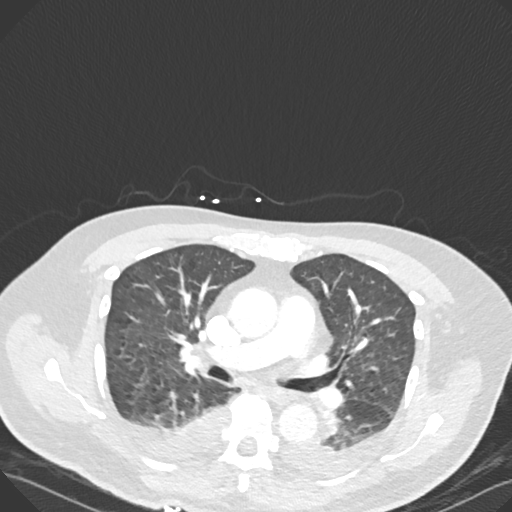
[im 224/407  mediastinal]
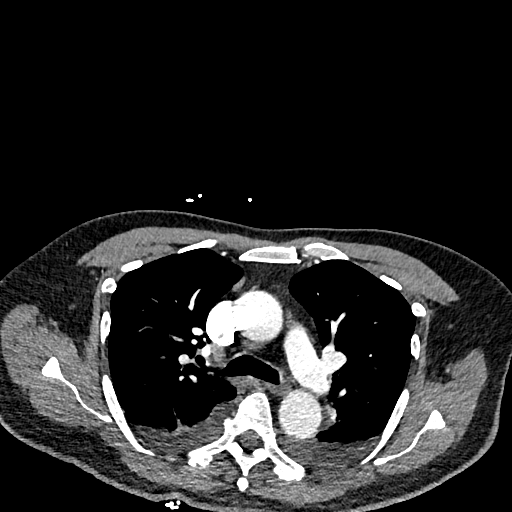
[im 244/407  lung]
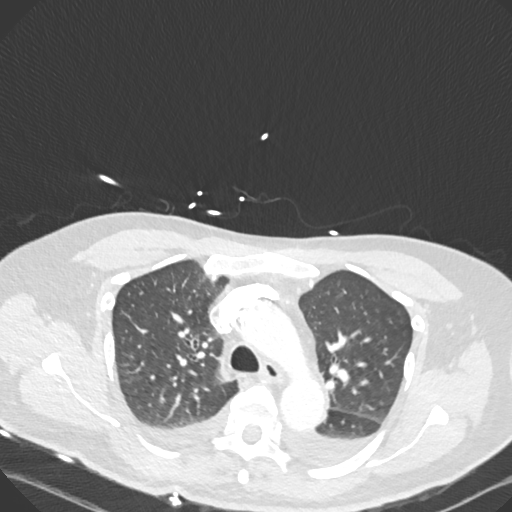
[im 264/407  mediastinal]
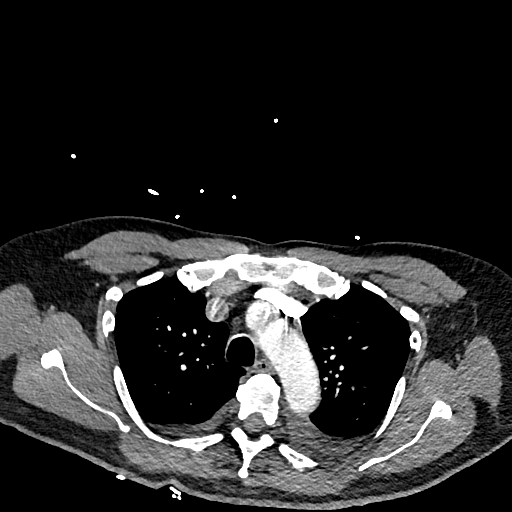
[im 285/407  lung]
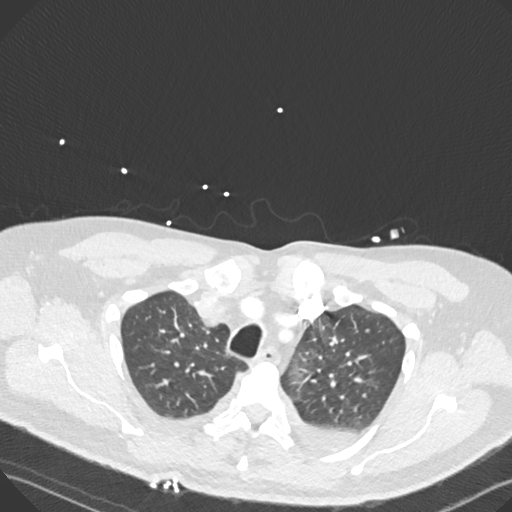
[im 325/407  mediastinal]
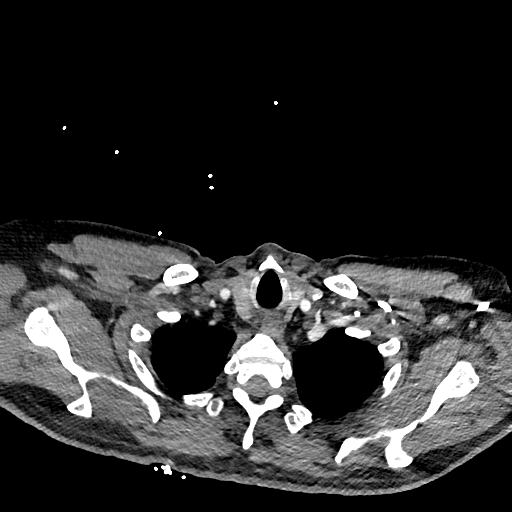
[im 346/407  lung]
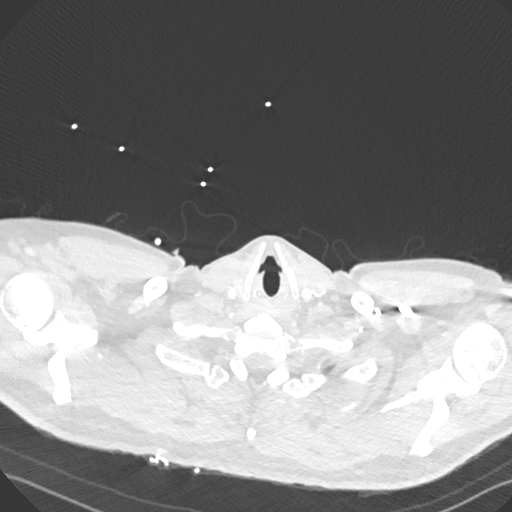
[im 366/407  mediastinal]
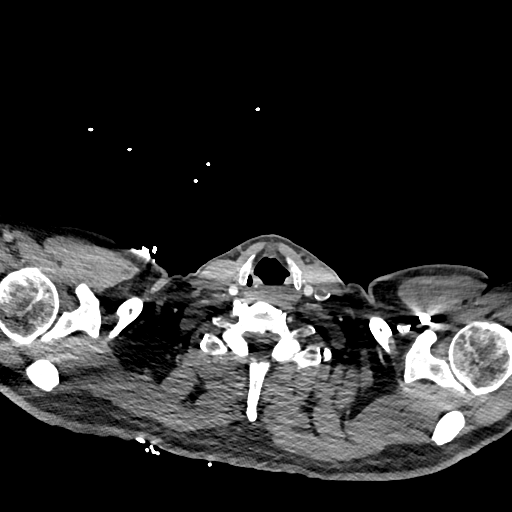
[im 386/407  lung]
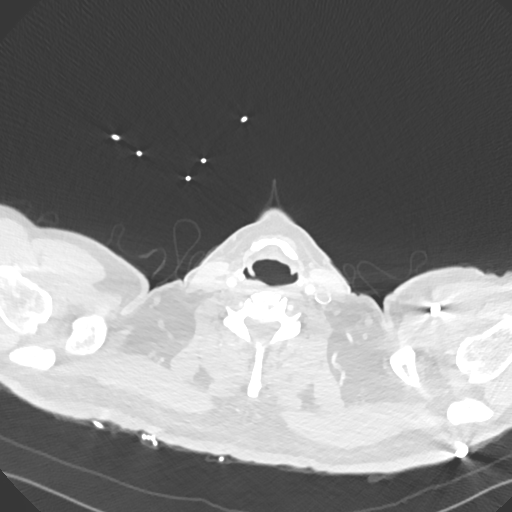

[Series 8: pe 2mm cor · coronal · 0.60mm/px · 1 of 140 slices shown]
[im 70/140  mediastinal]
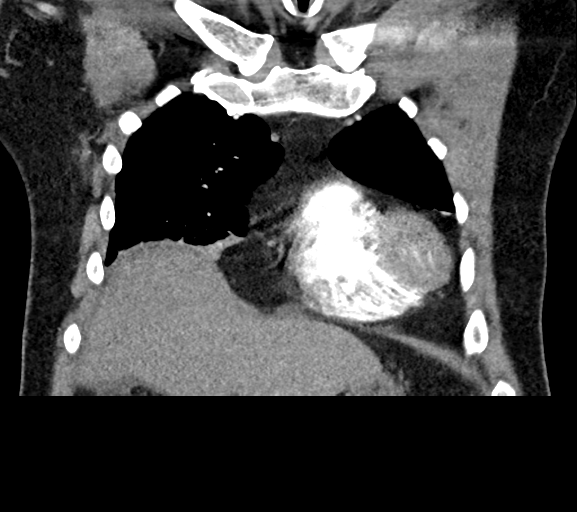

[18 of 36 positions shown; findings below may reference images not displayed]

FINDINGS: Cardiovascular: Aorta normal caliber without aneurysm or dissection.
Minimal atherosclerotic calcification aortic arch. Heart appears
mildly enlarged. No pericardial effusion. Pulmonary arteries
adequately opacified. Small BILATERAL filling defects are seen
within pulmonary arteries in the lower lobes and in the RIGHT middle
lobe consistent with small pulmonary emboli.

Mediastinum/Nodes: Base of cervical region normal appearance.
Esophagus unremarkable. No thoracic adenopathy.

Lungs/Pleura: Small BILATERAL pleural effusions with compressive
atelectasis of the adjacent lower lobes. Remaining lungs clear. No
infiltrate or pneumothorax.

Upper Abdomen: Infiltrative changes are seen in the perigastric
region in the upper abdomen extending to splenic hilum and
gastrosplenic ligament, related to pancreatitis seen on prior CT
abdomen exam.

Musculoskeletal: No acute osseous findings.

Review of the MIP images confirms the above findings.
IMPRESSION: Small BILATERAL pulmonary emboli located in the lower lobes and
RIGHT middle lobe.

Small BILATERAL pleural effusions and compressive atelectasis of the
posterior lungs.

LEFT upper quadrant infiltrative changes consistent with changes of
pancreatitis seen on prior CT.

Aortic Atherosclerosis ([id number]-ZSQ.Q).

Critical Value/emergent results were called by telephone at the time
of interpretation on 05/15/2019 at [DATE] to Dr. Drey Jim, who
verbally acknowledged these results.

## 2020-03-09 IMAGING — DX DG ABDOMEN 2V
2 series · 2 of 2 positions shown · non-contrast
Comparison: 05/14/2019

CLINICAL DATA: Abdominal distention.  Pancreatitis.

EXAM:
ABDOMEN - 2 VIEW

[abdomen erect]
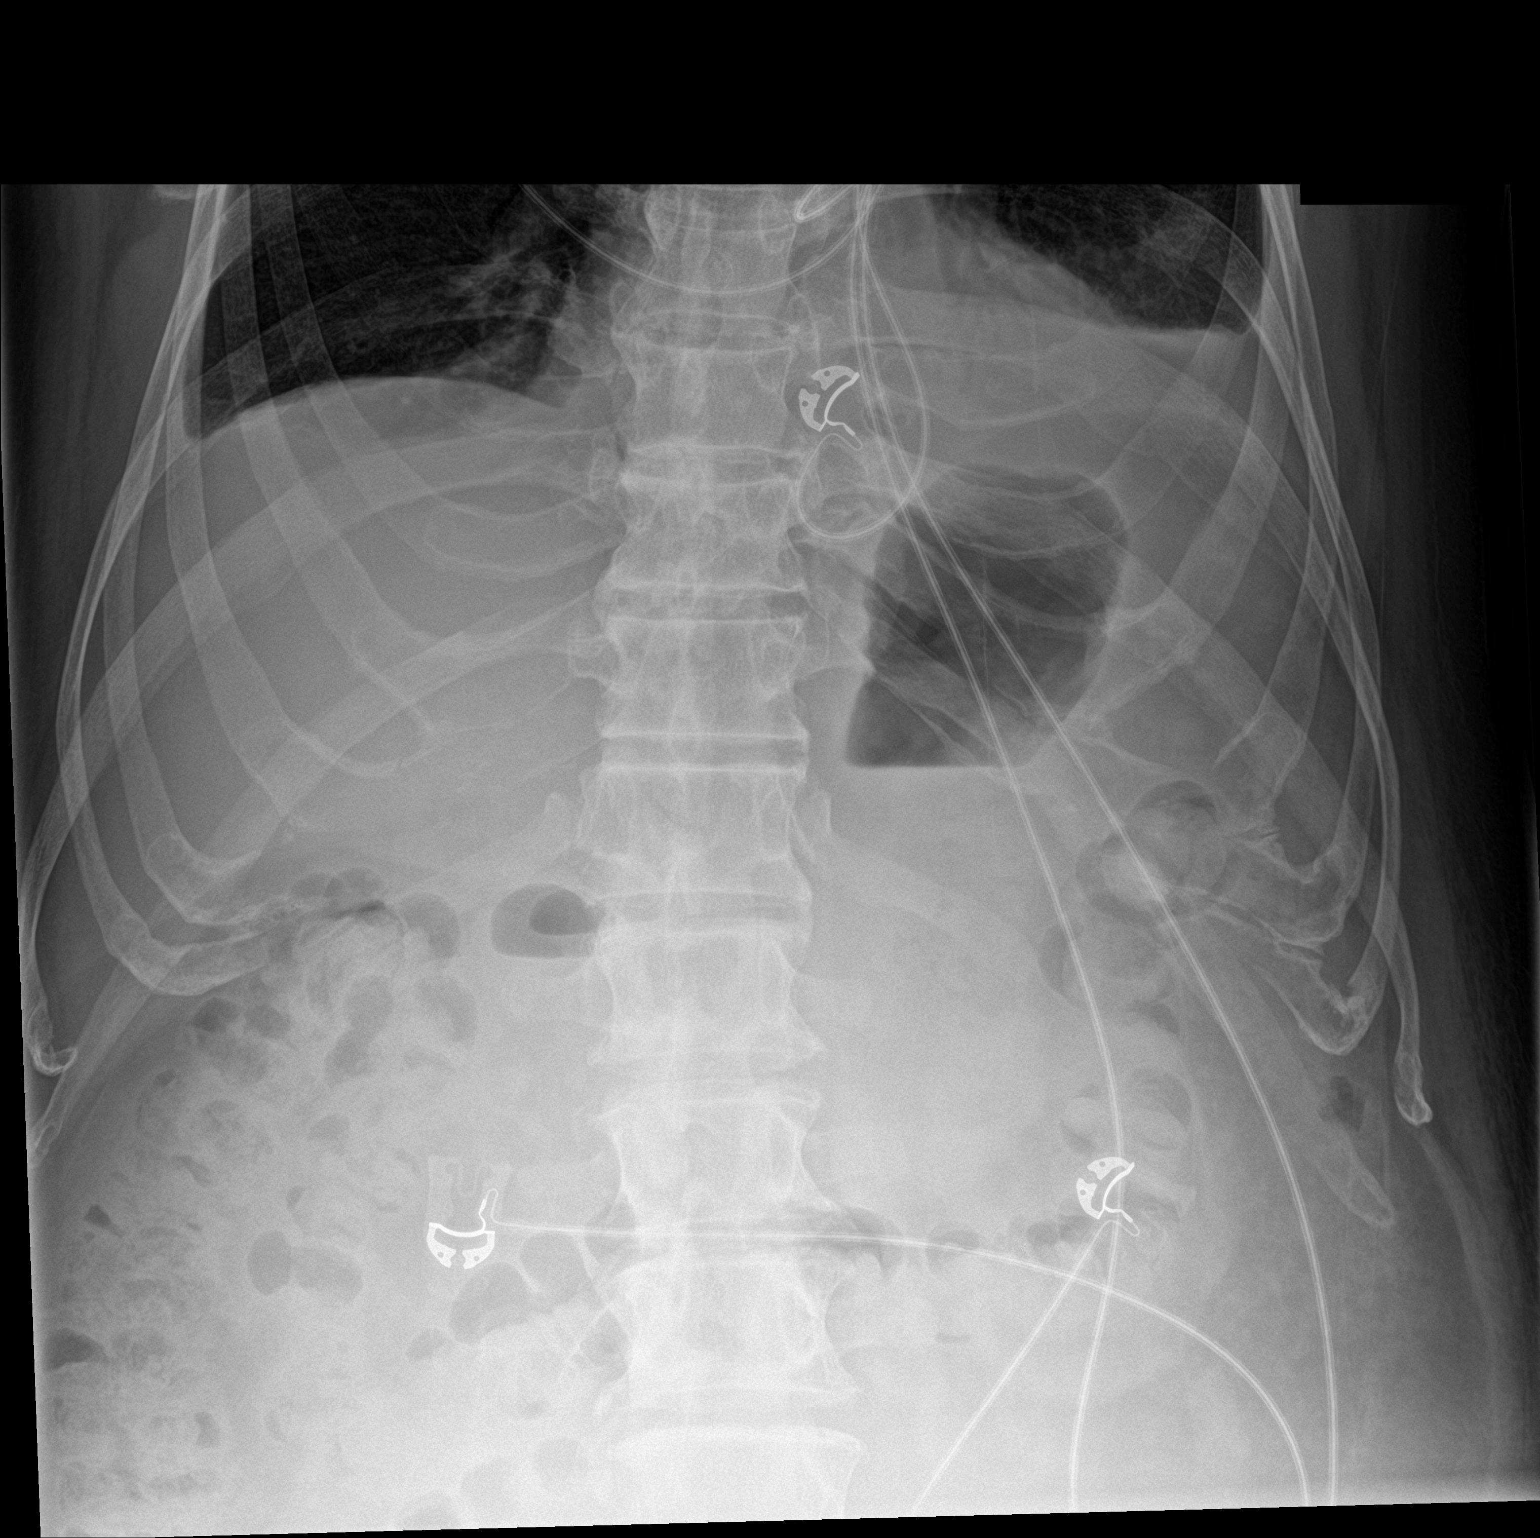

[abdomen supine]
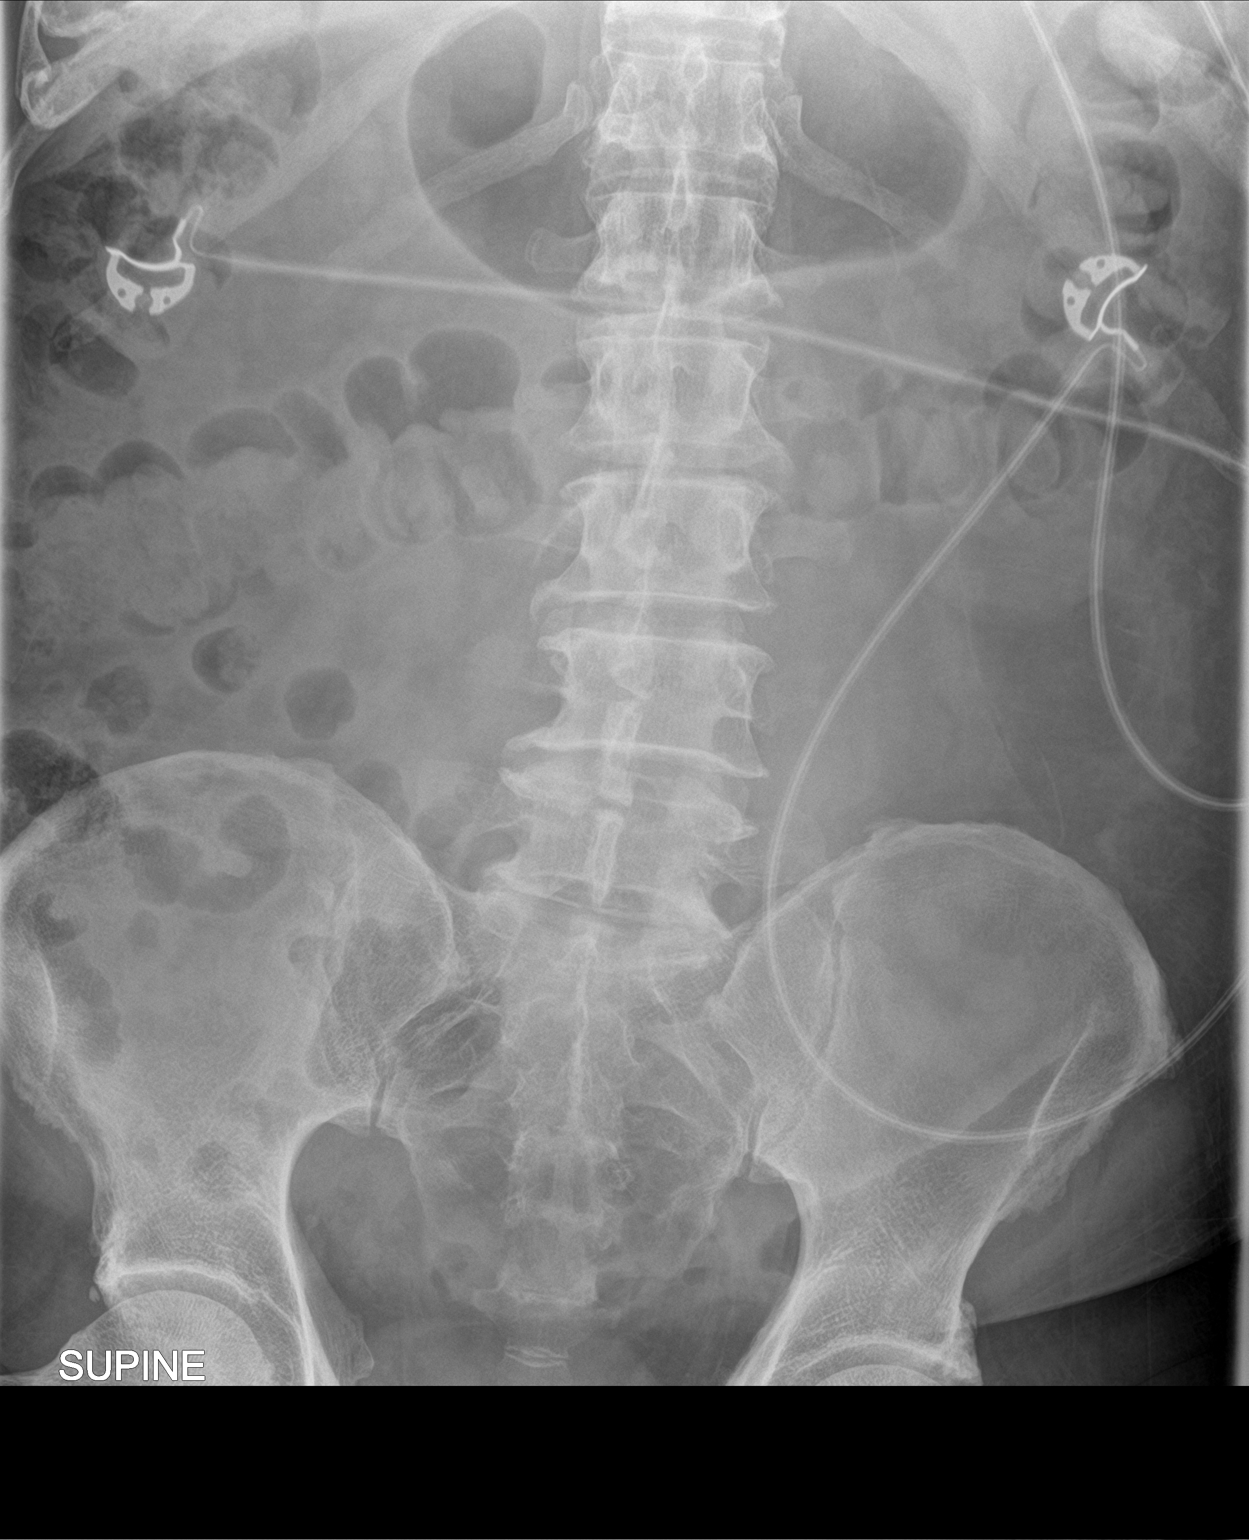

[2 of 2 positions shown; findings below may reference images not displayed]

FINDINGS: Mild gaseous distension of the stomach. Left pleural effusion noted.
The bowel gas pattern is normal. There is no evidence of free air.
No radio-opaque calculi or other significant radiographic
abnormality is seen.
IMPRESSION: 1. Nonobstructive bowel gas pattern.
2. Mild gaseous distension of the stomach

## 2020-08-15 ENCOUNTER — Other Ambulatory Visit: Payer: Self-pay | Admitting: Surgery

## 2020-08-15 ENCOUNTER — Ambulatory Visit: Payer: Self-pay | Admitting: Surgery

## 2020-08-15 NOTE — H&P (View-Only) (Signed)
History of Present Illness (Vonte Rossin L. [name]; 08/15/2020 2:55 PM) The patient is a 64 year old male who presents for evaluation of gall stones.[name] is a 64 yo male who was referred for gallstones. A little over a year ago he was hospitalized for 3 weeks with acute pancreatitis. A specific etiology for the pancreatitis was not identified during his admission. RUQ [place] and MRCP did not show any gallstones or choledocholithiasis. His max Tbili during his hospitalization was 1.3, which normalized prior to discharge. Triglycerides were normal at 106. He recently underwent an EGD/EUS by [name], which showed multiple small gallstones. The bile duct was normal in size, and there were no abnormalities in the liver. Without any other etiology for his pancreatitis, he was referred to discuss cholecystectomy.  Patient reports he would occasionally have a drink prior to his admission for pancreatitis, but never more than 1-2 drinks at a time, and he has not consumed any alcohol since had pancreatitis. He has no family history of pancreatitis. This was his only episode of pancreatitis, and he denies any further abdominal pain since he recovered. He is eating without difficulty and denies any postprandial pain. He has not had any prior abdominal surgeries.  He was treated with Eliquis for a PE diagnosed during his hospitalization, but this was stopped in October of this year. He takes no other anticoagulants or antiplatelets.  PMH: HTN, HLD, DM  PSH: none  SocHx: no EtOH use, nonsmoker  FHx: no family history of pancreatitis   Past Surgical History [name], [name]; 08/15/2020 2:15 PM) Colon Polyp Removal - Colonoscopy  Oral Surgery   Diagnostic Studies History ([name], [name]; 08/15/2020 2:15 PM) Colonoscopy  1-5 years ago  Allergies ([name], [name]; 08/15/2020 2:15 PM) No Known Drug Allergies  [08/15/2020]: Allergies Reconciled   Medication History ([name], [name]; 08/15/2020 2:16  PM) metFORMIN HCl (500MG  Tablet, Oral) Active. Simvastatin (20MG  Tablet, Oral) Active. Carvedilol (12.5MG  Tablet, Oral) Active. Medications Reconciled  Social History , [name]; 08/15/2020 2:15 PM) No alcohol use  No caffeine use  No drug use  Tobacco use  Never smoker.  Family History ([name], [name]; 08/15/2020 2:15 PM) Cerebrovascular Accident  Father. Diabetes Mellitus  Mother. Heart Disease  Mother. Hypertension  Father.  Other Problems ([name], [name]; 08/15/2020 2:15 PM) Cholelithiasis  Hemorrhoids  High blood pressure  Hypercholesterolemia  Kidney Stone  Pancreatitis     Review of Systems ([name]; 08/15/2020 2:15 PM) General Not Present- Appetite Loss, Chills, Fatigue, Fever, Night Sweats, Weight Gain and Weight Loss. Skin Not Present- Change in Wart/Mole, Dryness, Hives, Jaundice, New Lesions, Non-Healing Wounds, Rash and Ulcer. HEENT Not Present- Earache, Hearing Loss, Hoarseness, Nose Bleed, Oral Ulcers, Ringing in the Ears, Seasonal Allergies, Sinus Pain, Sore Throat, Visual Disturbances, Wears glasses/contact lenses and Yellow Eyes. Respiratory Not Present- Bloody sputum, Chronic Cough, Difficulty Breathing, Snoring and Wheezing. Cardiovascular Not Present- Chest Pain, Difficulty Breathing Lying Down, Leg Cramps, Palpitations, Rapid Heart Rate, Shortness of Breath and Swelling of Extremities. Gastrointestinal Not Present- Abdominal Pain, Bloating, Bloody Stool, Change in Bowel Habits, Chronic diarrhea, Constipation, Difficulty Swallowing, Excessive gas, Gets full quickly at meals, Hemorrhoids, Indigestion, Nausea, Rectal Pain and Vomiting. Male Genitourinary Not Present- Blood in Urine, Change in Urinary Stream, Frequency, Impotence, Nocturia, Painful Urination, Urgency and Urine Leakage. Musculoskeletal Not Present- Back Pain, Joint Pain, Joint Stiffness, Muscle Pain, Muscle Weakness and Swelling of Extremities. Neurological  Not Present- Decreased Memory, Fainting, Headaches, Numbness, Seizures, Tingling, Tremor,  Trouble walking and Weakness. Psychiatric Not Present- Anxiety, Bipolar, Change in Sleep Pattern, Depression, Fearful and Frequent crying. Endocrine Not Present- Cold Intolerance, Excessive Hunger, Hair Changes, Heat Intolerance, Hot flashes and New Diabetes. Hematology Not Present- Blood Thinners, Easy Bruising, Excessive bleeding, Gland problems, HIV and Persistent Infections.  Vitals ([name]; 08/15/2020 2:16 PM) 08/15/2020 2:16 PM Weight: 204.38 lb Height: 71in Body Surface Area: 2.13 m Body Mass Index: 28.5 kg/m  Temp.: 96.7F  Pulse: 87 (Regular)  BP: 124/74(Sitting, Left Arm, Standard)       Physical Exam (Miho Monda L. Sharmeka Palmisano MD; 08/15/2020 2:50 PM) The physical exam findings are as follows: Note: Constitutional: No acute distress; conversant; no deformities Neuro: alert and oriented; cranial nerves grossly in tact; no focal deficits Eyes: Moist conjunctiva; anicteric sclerae; extraocular movements in tact Neck: Trachea midline Lungs: Normal respiratory effort; lungs clear to auscultation bilaterally; symmetric chest wall expansion CV: Regular rate and rhythm; no murmurs; no pitting edema GI: Abdomen soft, nontender, nondistended; no masses or organomegaly; no surgical scars MSK: Normal gait and station; no clubbing/cyanosis Psychiatric: Appropriate affect; alert and oriented 3    Assessment & Plan (Mateya Torti L. Donn Wilmot MD; 08/15/2020 2:55 PM) GALLSTONE PANCREATITIS (K85.10) Impression: 64 yo male referred for gallstones after an episode of acute pancreatitis. I reviewed his imaging and EUS report. No stones were present on his RUQ US or MRCP during his admission, however I suspect they were too small to be seen on cross-sectional imaging. Dr. Outlaw's EUS note indicates the stones were easily visible but small. Patient denies significant EtOH use and does not have  hypertriglyceridemia or a familial history of pancreatitis. In the absence of any other clear etiology for his acute pancreatitis, I agree that it was most likely biliary pancreatitis. Recommend cholecystectomy to prevent a recurrence. The details of the procedure were discussed with the patient and the risks and benefits were explained, including bleeding, infection, bile leak, and <1% risk of common bile duct injury. The patient expressed understanding and agrees to proceed with surgery. Education pamphlet on lap chole provided. Will schedule for elective laparoscopic cholecystectomy.   Allysha Tryon, MD [facility] General, Hepatobiliary and Pancreatic Surgery 08/15/20 3:16 PM   

## 2020-08-15 NOTE — H&P (Signed)
History of Present Illness (Mark Kidd L. Mark Busman MD; 08/15/2020 2:55 PM) The patient is a 64 year old male who presents for evaluation of gall stones.Mr. Mark Kidd is a 64 yo male who was referred for gallstones. A little over a year ago he was hospitalized for 3 weeks with acute pancreatitis. A specific etiology for the pancreatitis was not identified during his admission. RUQ [place] and MRCP did not show any gallstones or choledocholithiasis. His max Tbili during his hospitalization was 1.3, which normalized prior to discharge. Triglycerides were normal at 106. He recently underwent an EGD/EUS by [name], which showed multiple small gallstones. The bile duct was normal in size, and there were no abnormalities in the liver. Without any other etiology for his pancreatitis, he was referred to discuss cholecystectomy.  Patient reports he would occasionally have a drink prior to his admission for pancreatitis, but never more than 1-2 drinks at a time, and he has not consumed any alcohol since had pancreatitis. He has no family history of pancreatitis. This was his only episode of pancreatitis, and he denies any further abdominal pain since he recovered. He is eating without difficulty and denies any postprandial pain. He has not had any prior abdominal surgeries.  He was treated with Eliquis for a PE diagnosed during his hospitalization, but this was stopped in October of this year. He takes no other anticoagulants or antiplatelets.  PMH: HTN, HLD, DM  PSH: none  SocHx: no EtOH use, nonsmoker  FHx: no family history of pancreatitis   Past Surgical History Mark Kidd, [name]; 08/15/2020 2:15 PM) Colon Polyp Removal - Colonoscopy  Oral Surgery   Diagnostic Studies History (Mark Kidd, [name]; 08/15/2020 2:15 PM) Colonoscopy  1-5 years ago  Allergies (Mark Kidd, [name]; 08/15/2020 2:15 PM) No Known Drug Allergies  [08/15/2020]: Allergies Reconciled   Medication History (Mark Kidd, [name]; 08/15/2020 2:16  PM) metFORMIN HCl (500MG  Tablet, Oral) Active. Simvastatin (20MG  Tablet, Oral) Active. Carvedilol (12.5MG  Tablet, Oral) Active. Medications Reconciled  Social History , [name]; 08/15/2020 2:15 PM) No alcohol use  No caffeine use  No drug use  Tobacco use  Never smoker.  Family History (Mark Kidd, [name]; 08/15/2020 2:15 PM) Cerebrovascular Accident  Father. Diabetes Mellitus  Mother. Heart Disease  Mother. Hypertension  Father.  Other Problems (Mark Kidd, [name]; 08/15/2020 2:15 PM) Cholelithiasis  Hemorrhoids  High blood pressure  Hypercholesterolemia  Kidney Stone  Pancreatitis     Review of Systems (Mark Kidd [name]; 08/15/2020 2:15 PM) General Not Present- Appetite Loss, Chills, Fatigue, Fever, Night Sweats, Weight Gain and Weight Loss. Skin Not Present- Change in Wart/Mole, Dryness, Hives, Jaundice, New Lesions, Non-Healing Wounds, Rash and Ulcer. HEENT Not Present- Earache, Hearing Loss, Hoarseness, Nose Bleed, Oral Ulcers, Ringing in the Ears, Seasonal Allergies, Sinus Pain, Sore Throat, Visual Disturbances, Wears glasses/contact lenses and Yellow Eyes. Respiratory Not Present- Bloody sputum, Chronic Cough, Difficulty Breathing, Snoring and Wheezing. Cardiovascular Not Present- Chest Pain, Difficulty Breathing Lying Down, Leg Cramps, Palpitations, Rapid Heart Rate, Shortness of Breath and Swelling of Extremities. Gastrointestinal Not Present- Abdominal Pain, Bloating, Bloody Stool, Change in Bowel Habits, Chronic diarrhea, Constipation, Difficulty Swallowing, Excessive gas, Gets full quickly at meals, Hemorrhoids, Indigestion, Nausea, Rectal Pain and Vomiting. Male Genitourinary Not Present- Blood in Urine, Change in Urinary Stream, Frequency, Impotence, Nocturia, Painful Urination, Urgency and Urine Leakage. Musculoskeletal Not Present- Back Pain, Joint Pain, Joint Stiffness, Muscle Pain, Muscle Weakness and Swelling of Extremities. Neurological  Not Present- Decreased Memory, Fainting, Headaches, Numbness, Seizures, Tingling, Tremor,  Trouble walking and Weakness. Psychiatric Not Present- Anxiety, Bipolar, Change in Sleep Pattern, Depression, Fearful and Frequent crying. Endocrine Not Present- Cold Intolerance, Excessive Hunger, Hair Changes, Heat Intolerance, Hot flashes and New Diabetes. Hematology Not Present- Blood Thinners, Easy Bruising, Excessive bleeding, Gland problems, HIV and Persistent Infections.  Vitals (Mark Kidd [name]; 08/15/2020 2:16 PM) 08/15/2020 2:16 PM Weight: 204.38 lb Height: 71in Body Surface Area: 2.13 m Body Mass Index: 28.5 kg/m  Temp.: 96.42F  Pulse: 87 (Regular)  BP: 124/74(Sitting, Left Arm, Standard)       Physical Exam (Mark Kidd L. Mark Busman MD; 08/15/2020 2:50 PM) The physical exam findings are as follows: Note: Constitutional: No acute distress; conversant; no deformities Neuro: alert and oriented; cranial nerves grossly in tact; no focal deficits Eyes: Moist conjunctiva; anicteric sclerae; extraocular movements in tact Neck: Trachea midline Lungs: Normal respiratory effort; lungs clear to auscultation bilaterally; symmetric chest wall expansion CV: Regular rate and rhythm; no murmurs; no pitting edema GI: Abdomen soft, nontender, nondistended; no masses or organomegaly; no surgical scars MSK: Normal gait and station; no clubbing/cyanosis Psychiatric: Appropriate affect; alert and oriented 3    Assessment & Plan (Mark Kidd L. Mark Busman MD; 08/15/2020 2:55 PM) GALLSTONE PANCREATITIS (K85.10) Impression: 64 yo male referred for gallstones after an episode of acute pancreatitis. I reviewed his imaging and EUS report. No stones were present on his RUQ [place] or MRCP during his admission, however I suspect they were too small to be seen on cross-sectional imaging. Dr. Hulen Kidd EUS note indicates the stones were easily visible but small. Patient denies significant EtOH use and does not have  hypertriglyceridemia or a familial history of pancreatitis. In the absence of any other clear etiology for his acute pancreatitis, I agree that it was most likely biliary pancreatitis. Recommend cholecystectomy to prevent a recurrence. The details of the procedure were discussed with the patient and the risks and benefits were explained, including bleeding, infection, bile leak, and <1% risk of common bile duct injury. The patient expressed understanding and agrees to proceed with surgery. Education pamphlet on lap chole provided. Will schedule for elective laparoscopic cholecystectomy.   Sophronia Simas, MD [facility] General, Hepatobiliary and Pancreatic Surgery 08/15/20 3:16 PM

## 2020-08-25 ENCOUNTER — Other Ambulatory Visit (HOSPITAL_COMMUNITY)
Admission: RE | Admit: 2020-08-25 | Discharge: 2020-08-25 | Disposition: A | Discharge status: Home / Self Care | Payer: 59 | Source: Ambulatory Visit | Attending: Surgery | Admitting: Surgery

## 2020-08-25 DIAGNOSIS — Z20822 Contact with and (suspected) exposure to covid-19: Secondary | ICD-10-CM | POA: Diagnosis not present

## 2020-08-25 DIAGNOSIS — Z01812 Encounter for preprocedural laboratory examination: Secondary | ICD-10-CM | POA: Insufficient documentation

## 2020-08-25 LAB — SARS CORONAVIRUS 2 (TAT 6-24 HRS): SARS Coronavirus 2: NEGATIVE

## 2020-08-26 ENCOUNTER — Encounter (HOSPITAL_COMMUNITY): Payer: Self-pay

## 2020-08-26 NOTE — Patient Instructions (Addendum)
_____________________________________________________________________  YOU NEED TO HAVE A COVID 19 TEST ON_    DONE______ @_______ , THIS TEST MUST BE DONE BEFORE SURGERY,  COVID TESTING SITE [address] [place] , IT IS ON THE RIGHT GOING OUT [address] APPROXIMATELY  2 MINUTES PAST [employer] ON THE RIGHT. ONCE YOUR COVID TEST IS COMPLETED,  PLEASE BEGIN THE QUARANTINE INSTRUCTIONS AS OUTLINED IN YOUR HANDOUT.              DUE TO COVID-19 ONLY ONE VISITOR IS ALLOWED TO COME WITH YOU AND STAY IN THE WAITING ROOM ONLY DURING PRE OP AND PROCEDURE DAY OF SURGERY. THE 1 VISITOR  MAY VISIT WITH YOU AFTER SURGERY IN YOUR PRIVATE ROOM DURING VISITING HOURS ONLY!    PLEASE BEGIN THE QUARANTINE INSTRUCTIONS AS OUTLINED IN YOUR HANDOUT.                Mark Kidd    Your procedure is scheduled on: 08/28/20   Report to [facility] Main  Entrance   Report to admitting at   12:45 PM     Call this number if you have problems the morning of surgery [phone]    Remember: Do not eat food After Midnight                                     You may have clear liquids until  11:45  AM  Then nothing by mouth     CLEAR LIQUID DIET   Foods Allowed                                                                     Black Coffee and tea, regular and decaf                            Plain Jell-O any favor except red or purple                                            Fruit ices (not with fruit pulp)                                    Iced Popsicles                                    Carbonated beverages, regular and diet                                    Cranberry, grape and apple juices Sports drinks like Gatorade Lightly seasoned clear broth or consume(fat free) Sugar, honey syrup    BRUSH YOUR TEETH MORNING OF SURGERY AND RINSE YOUR MOUTH OUT, NO CHEWING GUM CANDY OR MINTS.     Take these medicines the morning of surgery with A SIP  OF WATER:  Carvedilol       How to Manage Your Diabetes Before and After Surgery  Why is it important to control my blood sugar before and after surgery? . Improving blood sugar levels before and after surgery helps healing and can limit problems. . A way of improving blood sugar control is eating a healthy diet by: o  Eating less sugar and carbohydrates o  Increasing activity/exercise o  Talking with your doctor about reaching your blood sugar goals . High blood sugars (greater than 180 mg/dL) can raise your risk of infections and slow your recovery, so you will need to focus on controlling your diabetes during the weeks before surgery. . Make sure that the doctor who takes care of your diabetes knows about your planned surgery including the date and location.  How do I manage my blood sugar before surgery? . Check your blood sugar at least 4 times a day, starting 2 days before surgery, to make sure that the level is not too high or low. o Check your blood sugar the morning of your surgery when you wake up and every 2 hours until you get to the Short Stay unit. . If your blood sugar is less than 70 mg/dL, you will need to treat for low blood sugar: o Do not take insulin. o Treat a low blood sugar (less than 70 mg/dL) with  cup of clear juice (cranberry or apple), 4 glucose tablets, OR glucose gel. o Recheck blood sugar in 15 minutes after treatment (to make sure it is greater than 70 mg/dL). If your blood sugar is not greater than 70 mg/dL on recheck, call [phone] for further instructions. . Report your blood sugar to the short stay nurse when you get to Short Stay.  . If you are admitted to the hospital after surgery: o Your blood sugar will be checked by the staff and you will probably be given insulin after surgery (instead of oral diabetes medicines) to make sure you have good blood sugar levels. o The goal for blood sugar control after surgery is 80-180 mg/dL.   WHAT DO I DO ABOUT MY  DIABETES MEDICATION?  Marland Kitchen Do not take oral diabetes medicines (pills) the morning of surgery.                           You may not have any metal on your body including              piercings  Do not wear jewelry,  lotions, powders or deodorant              Men may shave face and neck.   Do not bring valuables to the hospital. [place] IS NOT             RESPONSIBLE   FOR VALUABLES.  Contacts, dentures or bridgework may not be worn into surgery.      Patients discharged the day of surgery will not be allowed to drive home.   IF YOU ARE HAVING SURGERY AND GOING HOME THE SAME DAY, YOU MUST HAVE AN ADULT TO DRIVE YOU HOME AND BE WITH YOU FOR 24 HOURS.   YOU MAY GO HOME BY TAXI OR UBER OR ORTHERWISE, BUT AN ADULT MUST ACCOMPANY YOU HOME AND STAY WITH YOU FOR 24 HOURS.  Name and phone number of your driver:  Special Instructions: N/A  Please read over the following fact sheets you were given: _____________________________________________________________________             [facility] - Preparing for Surgery Before surgery, you can play an important role.   Because skin is not sterile, your skin needs to be as free of germs as possible.   You can reduce the number of germs on your skin by washing with CHG (chlorahexidine gluconate) soap before surgery.   CHG is an antiseptic cleaner which kills germs and bonds with the skin to continue killing germs even after washing. Please DO NOT use if you have an allergy to CHG or antibacterial soaps.   If your skin becomes reddened/irritated stop using the CHG and inform your nurse when you arrive at Short Stay. .  You may shave your face/neck.  Please follow these instructions carefully:  1.  Shower with CHG Soap the night before surgery and the  morning of Surgery.  2.  If you choose to wash your hair, wash your hair first as usual with your  normal  shampoo.  3.  After you shampoo, rinse your hair and body thoroughly to  remove the  shampoo.                                        4.  Use CHG as you would any other liquid soap.  You can apply chg directly  to the skin and wash                       Gently with a scrungie or clean washcloth.  5.  Apply the CHG Soap to your body ONLY FROM THE NECK DOWN.   Do not use on face/ open                           Wound or open sores. Avoid contact with eyes, ears mouth and genitals (private parts).                       Wash face,  Genitals (private parts) with your normal soap.             6.  Wash thoroughly, paying special attention to the area where your surgery  will be performed.  7.  Thoroughly rinse your body with warm water from the neck down.  8.  DO NOT shower/wash with your normal soap after using and rinsing off  the CHG Soap.             9.  Pat yourself dry with a clean towel.            10.  Wear clean pajamas.            11.  Place clean sheets on your bed the night of your first shower and do not  sleep with pets. Day of Surgery : Do not apply any lotions/deodorants the morning of surgery.  Please wear clean clothes to the hospital/surgery center.  FAILURE TO FOLLOW THESE INSTRUCTIONS MAY RESULT IN THE CANCELLATION OF YOUR SURGERY PATIENT SIGNATURE_________________________________  NURSE SIGNATURE__________________________________  ________________________________________________________________________

## 2020-08-27 ENCOUNTER — Other Ambulatory Visit: Payer: Self-pay

## 2020-08-27 ENCOUNTER — Encounter (HOSPITAL_COMMUNITY): Payer: Self-pay

## 2020-08-27 ENCOUNTER — Encounter (HOSPITAL_COMMUNITY)
Admission: RE | Admit: 2020-08-27 | Discharge: 2020-08-27 | Disposition: A | Discharge status: Home / Self Care | Payer: 59 | Source: Ambulatory Visit | Attending: Surgery | Admitting: Surgery

## 2020-08-27 DIAGNOSIS — Z01818 Encounter for other preprocedural examination: Secondary | ICD-10-CM | POA: Insufficient documentation

## 2020-08-27 HISTORY — DX: Personal history of urinary calculi: Z87.442

## 2020-08-27 HISTORY — DX: Acute pancreatitis without necrosis or infection, unspecified: K85.90

## 2020-08-27 LAB — CBC
HCT: 49.5 % (ref 39.0–52.0)
Hemoglobin: 17.1 g/dL — ABNORMAL HIGH (ref 13.0–17.0)
MCH: 31 pg (ref 26.0–34.0)
MCHC: 34.5 g/dL (ref 30.0–36.0)
MCV: 89.7 fL (ref 80.0–100.0)
Platelets: 198 10*3/uL (ref 150–400)
RBC: 5.52 MIL/uL (ref 4.22–5.81)
RDW: 12.3 % (ref 11.5–15.5)
WBC: 7.2 10*3/uL (ref 4.0–10.5)
nRBC: 0 % (ref 0.0–0.2)

## 2020-08-27 LAB — HEMOGLOBIN A1C
Hgb A1c MFr Bld: 6.1 % — ABNORMAL HIGH (ref 4.8–5.6)
Mean Plasma Glucose: 128.37 mg/dL

## 2020-08-27 LAB — BASIC METABOLIC PANEL
Anion gap: 10 (ref 5–15)
BUN: 23 mg/dL (ref 8–23)
CO2: 22 mmol/L (ref 22–32)
Calcium: 9.6 mg/dL (ref 8.9–10.3)
Chloride: 105 mmol/L (ref 98–111)
Creatinine, Ser: 0.96 mg/dL (ref 0.61–1.24)
GFR, Estimated: >60 mL/min (ref >60)
Glucose, Bld: 90 mg/dL (ref 70–99)
Potassium: 4.2 mmol/L (ref 3.5–5.1)
Sodium: 137 mmol/L (ref 135–145)

## 2020-08-27 LAB — GLUCOSE, CAPILLARY: Glucose-Capillary: 96 mg/dL (ref 70–99)

## 2020-08-27 NOTE — Progress Notes (Addendum)
PCP - Dr.  Burton Apley Cardiologist - no  PPM/ICD -  Device Orders -  Rep Notified -   Chest x-ray - 05-14-19 epic EKG - 12-19-19 requested from Dr. Su Hilt  Stress Test -  ECHO - 05-18-19 epic Cardiac Cath -   Sleep Study -  CPAP -   Fasting Blood Sugar -  Checks Blood Sugar __0___ times a day  Blood Thinner Instructions: Aspirin Instructions:  [facility] Protcol - PRE-SURGERY   COVID TEST- 08-25-20  Activity -30 minutes running treadmill every day without SOB Anesthesia review: DM, HTN  Patient denies shortness of breath, fever, cough and chest pain at PAT appointment  NONE   All instructions explained to the patient, with a verbal understanding of the material. Patient agrees to go over the instructions while at home for a better understanding. Patient also instructed to self quarantine after being tested for COVID-19. The opportunity to ask questions was provided.

## 2020-08-28 ENCOUNTER — Encounter (HOSPITAL_COMMUNITY): Payer: Self-pay | Admitting: Surgery

## 2020-08-28 ENCOUNTER — Ambulatory Visit (HOSPITAL_COMMUNITY): Payer: 59 | Admitting: Anesthesiology

## 2020-08-28 ENCOUNTER — Encounter (HOSPITAL_COMMUNITY)
Admission: RE | Disposition: A | Discharge status: Home / Self Care | Payer: Self-pay | Source: Home / Self Care | Attending: Surgery

## 2020-08-28 ENCOUNTER — Ambulatory Visit (HOSPITAL_COMMUNITY)
Admission: RE | Admit: 2020-08-28 | Discharge: 2020-08-28 | Disposition: A | Discharge status: Home / Self Care | Payer: 59 | Attending: Surgery | Admitting: Surgery

## 2020-08-28 DIAGNOSIS — K8010 Calculus of gallbladder with chronic cholecystitis without obstruction: Secondary | ICD-10-CM | POA: Insufficient documentation

## 2020-08-28 DIAGNOSIS — Z8711 Personal history of peptic ulcer disease: Secondary | ICD-10-CM | POA: Insufficient documentation

## 2020-08-28 DIAGNOSIS — Z7984 Long term (current) use of oral hypoglycemic drugs: Secondary | ICD-10-CM | POA: Diagnosis not present

## 2020-08-28 DIAGNOSIS — Z79899 Other long term (current) drug therapy: Secondary | ICD-10-CM | POA: Diagnosis not present

## 2020-08-28 HISTORY — PX: CHOLECYSTECTOMY: SHX55

## 2020-08-28 LAB — GLUCOSE, CAPILLARY
Glucose-Capillary: 115 mg/dL — ABNORMAL HIGH (ref 70–99)
Glucose-Capillary: 132 mg/dL — ABNORMAL HIGH (ref 70–99)

## 2020-08-28 SURGERY — LAPAROSCOPIC CHOLECYSTECTOMY
Anesthesia: General | Site: Abdomen

## 2020-08-28 MED ORDER — PROPOFOL 10 MG/ML IV BOLUS
INTRAVENOUS | Status: DC | PRN
  Administered 2020-08-28: 150 mg via INTRAVENOUS

## 2020-08-28 MED ORDER — ROCURONIUM BROMIDE 10 MG/ML (PF) SYRINGE
PREFILLED_SYRINGE | INTRAVENOUS | Status: AC
  Filled 2020-08-28: qty 20

## 2020-08-28 MED ORDER — EPHEDRINE SULFATE-NACL 50-0.9 MG/10ML-% IV SOSY
PREFILLED_SYRINGE | INTRAVENOUS | Status: DC | PRN
  Administered 2020-08-28: 10 mg via INTRAVENOUS

## 2020-08-28 MED ORDER — ONDANSETRON HCL 4 MG/2ML IJ SOLN: 4.0000 mg | Freq: Once | INTRAMUSCULAR | Status: DC | PRN

## 2020-08-28 MED ORDER — CEFAZOLIN SODIUM-DEXTROSE 2-4 GM/100ML-% IV SOLN
2.0000 g | INTRAVENOUS | Status: AC
  Administered 2020-08-28: 16:00:00 2 g via INTRAVENOUS

## 2020-08-28 MED ORDER — 0.9 % SODIUM CHLORIDE (POUR BTL) OPTIME
TOPICAL | Status: DC | PRN
  Administered 2020-08-28: 17:00:00 1000 mL

## 2020-08-28 MED ORDER — ACETAMINOPHEN 500 MG PO TABS
1000.0000 mg | ORAL_TABLET | ORAL | Status: AC
  Administered 2020-08-28: 13:00:00 1000 mg via ORAL
  Filled 2020-08-28: qty 2

## 2020-08-28 MED ORDER — LIDOCAINE 2% (20 MG/ML) 5 ML SYRINGE
INTRAMUSCULAR | Status: DC | PRN
  Administered 2020-08-28: 40 mg via INTRAVENOUS

## 2020-08-28 MED ORDER — GABAPENTIN 300 MG PO CAPS
300.0000 mg | ORAL_CAPSULE | ORAL | Status: AC
  Administered 2020-08-28: 13:00:00 300 mg via ORAL
  Filled 2020-08-28: qty 1

## 2020-08-28 MED ORDER — LACTATED RINGERS IR SOLN
Status: DC | PRN
  Administered 2020-08-28: 1000 mL

## 2020-08-28 MED ORDER — BUPIVACAINE HCL (PF) 0.25 % IJ SOLN
INTRAMUSCULAR | Status: AC
  Filled 2020-08-28: qty 30

## 2020-08-28 MED ORDER — BUPIVACAINE-EPINEPHRINE 0.5% -1:200000 IJ SOLN
INTRAMUSCULAR | Status: AC
  Filled 2020-08-28: qty 1

## 2020-08-28 MED ORDER — BUPIVACAINE-EPINEPHRINE (PF) 0.5% -1:200000 IJ SOLN
INTRAMUSCULAR | Status: DC | PRN
  Administered 2020-08-28: 20 mL

## 2020-08-28 MED ORDER — OXYCODONE HCL 5 MG PO TABS: 5.0000 mg | ORAL_TABLET | Freq: Four times a day (QID) | ORAL | 0 refills | Status: AC | PRN

## 2020-08-28 MED ORDER — SUGAMMADEX SODIUM 200 MG/2ML IV SOLN
INTRAVENOUS | Status: DC | PRN
  Administered 2020-08-28: 200 mg via INTRAVENOUS

## 2020-08-28 MED ORDER — LIDOCAINE HCL (PF) 2 % IJ SOLN
INTRAMUSCULAR | Status: AC
  Filled 2020-08-28: qty 10

## 2020-08-28 MED ORDER — ROCURONIUM BROMIDE 10 MG/ML (PF) SYRINGE
PREFILLED_SYRINGE | INTRAVENOUS | Status: DC | PRN
  Administered 2020-08-28 (×2): 10 mg via INTRAVENOUS
  Administered 2020-08-28: 50 mg via INTRAVENOUS

## 2020-08-28 MED ORDER — PHENYLEPHRINE 40 MCG/ML (10ML) SYRINGE FOR IV PUSH (FOR BLOOD PRESSURE SUPPORT)
PREFILLED_SYRINGE | INTRAVENOUS | Status: AC
  Filled 2020-08-28: qty 30

## 2020-08-28 MED ORDER — ORAL CARE MOUTH RINSE: 15.0000 mL | Freq: Once | OROMUCOSAL | Status: AC

## 2020-08-28 MED ORDER — ONDANSETRON HCL 4 MG/2ML IJ SOLN
INTRAMUSCULAR | Status: DC | PRN
  Administered 2020-08-28: 4 mg via INTRAVENOUS

## 2020-08-28 MED ORDER — DEXAMETHASONE SODIUM PHOSPHATE 10 MG/ML IJ SOLN
INTRAMUSCULAR | Status: DC | PRN
  Administered 2020-08-28: 10 mg via INTRAVENOUS

## 2020-08-28 MED ORDER — FENTANYL CITRATE (PF) 250 MCG/5ML IJ SOLN
INTRAMUSCULAR | Status: DC | PRN
  Administered 2020-08-28 (×2): 50 ug via INTRAVENOUS
  Administered 2020-08-28: 100 ug via INTRAVENOUS
  Administered 2020-08-28: 50 ug via INTRAVENOUS

## 2020-08-28 MED ORDER — FENTANYL CITRATE (PF) 250 MCG/5ML IJ SOLN
INTRAMUSCULAR | Status: AC
  Filled 2020-08-28: qty 5

## 2020-08-28 MED ORDER — MIDAZOLAM HCL 2 MG/2ML IJ SOLN
INTRAMUSCULAR | Status: AC
  Filled 2020-08-28: qty 2

## 2020-08-28 MED ORDER — PHENYLEPHRINE HCL (PRESSORS) 10 MG/ML IV SOLN
INTRAVENOUS | Status: AC
  Filled 2020-08-28: qty 7

## 2020-08-28 MED ORDER — SUCCINYLCHOLINE CHLORIDE 200 MG/10ML IV SOSY
PREFILLED_SYRINGE | INTRAVENOUS | Status: AC
  Filled 2020-08-28: qty 10

## 2020-08-28 MED ORDER — SUGAMMADEX SODIUM 500 MG/5ML IV SOLN
INTRAVENOUS | Status: AC
  Filled 2020-08-28: qty 10

## 2020-08-28 MED ORDER — CHLORHEXIDINE GLUCONATE 0.12 % MT SOLN
15.0000 mL | Freq: Once | OROMUCOSAL | Status: AC
  Administered 2020-08-28: 13:00:00 15 mL via OROMUCOSAL

## 2020-08-28 MED ORDER — MIDAZOLAM HCL 2 MG/2ML IJ SOLN
INTRAMUSCULAR | Status: DC | PRN
  Administered 2020-08-28 (×2): 1 mg via INTRAVENOUS

## 2020-08-28 MED ORDER — LACTATED RINGERS IV SOLN: INTRAVENOUS | Status: DC

## 2020-08-28 MED ORDER — HYDROMORPHONE HCL 1 MG/ML IJ SOLN: 0.2500 mg | INTRAMUSCULAR | Status: DC | PRN

## 2020-08-28 MED ORDER — PROPOFOL 10 MG/ML IV BOLUS
INTRAVENOUS | Status: AC
  Filled 2020-08-28: qty 20

## 2020-08-28 SURGICAL SUPPLY — 65 items
ADH SKN CLS APL DERMABOND .7 (GAUZE/BANDAGES/DRESSINGS) ×1
APL PRP STRL LF DISP 70% ISPRP (MISCELLANEOUS) ×1
APL SRG 38 LTWT LNG FL B (MISCELLANEOUS) ×1
APL SWBSTK 6 STRL LF DISP (MISCELLANEOUS)
APPLICATOR ARISTA FLEXITIP XL (MISCELLANEOUS) ×2 IMPLANT
APPLICATOR COTTON TIP 6 STRL (MISCELLANEOUS) IMPLANT
APPLICATOR COTTON TIP 6IN STRL (MISCELLANEOUS)
APPLIER CLIP 5 13 M/L LIGAMAX5 (MISCELLANEOUS) ×3
APR CLP MED LRG 5 ANG JAW (MISCELLANEOUS) ×1
BAG SPEC RTRVL LRG 6X4 10 (ENDOMECHANICALS) ×1
BLADE SURG 15 STRL LF DISP TIS (BLADE) ×1 IMPLANT
BLADE SURG 15 STRL SS (BLADE) ×3
BLADE SURG SZ11 CARB STEEL (BLADE) ×3 IMPLANT
CABLE HIGH FREQUENCY MONO STRZ (ELECTRODE) IMPLANT
CHLORAPREP W/TINT 26 (MISCELLANEOUS) ×3 IMPLANT
CLIP APPLIE 5 13 M/L LIGAMAX5 (MISCELLANEOUS) ×1 IMPLANT
CLIP VESOLOCK MED LG 6/CT (CLIP) ×2 IMPLANT
COVER MAYO STAND STRL (DRAPES) IMPLANT
COVER SURGICAL LIGHT HANDLE (MISCELLANEOUS) ×3 IMPLANT
COVER WAND RF STERILE (DRAPES) IMPLANT
DECANTER SPIKE VIAL GLASS SM (MISCELLANEOUS) ×3 IMPLANT
DERMABOND ADVANCED (GAUZE/BANDAGES/DRESSINGS) ×2
DERMABOND ADVANCED .7 DNX12 (GAUZE/BANDAGES/DRESSINGS) ×1 IMPLANT
DRAPE C-ARM 42X120 X-RAY (DRAPES) IMPLANT
DRAPE UTILITY XL STRL (DRAPES) ×3 IMPLANT
ELECT L-HOOK LAP 45CM DISP (ELECTROSURGICAL)
ELECT PENCIL ROCKER SW 15FT (MISCELLANEOUS) ×3 IMPLANT
ELECT REM PT RETURN 15FT ADLT (MISCELLANEOUS) ×3 IMPLANT
ELECTRODE L-HOOK LAP 45CM DISP (ELECTROSURGICAL) IMPLANT
ENDOLOOP SUT PDS II  0 18 (SUTURE) ×6
ENDOLOOP SUT PDS II 0 18 (SUTURE) IMPLANT
GAUZE 4X4 16PLY RFD (DISPOSABLE) ×3 IMPLANT
GLOVE BIOGEL PI IND STRL 6 (GLOVE) ×1 IMPLANT
GLOVE BIOGEL PI INDICATOR 6 (GLOVE) ×2
GLOVE SURG POLYISO LF SZ5.5 (GLOVE) ×3 IMPLANT
GOWN STRL REUS W/TWL LRG LVL3 (GOWN DISPOSABLE) ×3 IMPLANT
GOWN STRL REUS W/TWL XL LVL3 (GOWN DISPOSABLE) ×6 IMPLANT
GRASPER SUT TROCAR 14GX15 (MISCELLANEOUS) IMPLANT
HEMOSTAT ARISTA ABSORB 3G PWDR (HEMOSTASIS) ×2 IMPLANT
HEMOSTAT SNOW SURGICEL 2X4 (HEMOSTASIS) IMPLANT
KIT BASIN OR (CUSTOM PROCEDURE TRAY) ×3 IMPLANT
KIT TURNOVER KIT A (KITS) ×2 IMPLANT
L-HOOK LAP DISP 36CM (ELECTROSURGICAL) ×3
LHOOK LAP DISP 36CM (ELECTROSURGICAL) ×1 IMPLANT
NDL INSUFFLATION 14GA 120MM (NEEDLE) IMPLANT
NEEDLE HYPO 22GX1.5 SAFETY (NEEDLE) ×3 IMPLANT
NEEDLE INSUFFLATION 14GA 120MM (NEEDLE) IMPLANT
PACK UNIVERSAL I (CUSTOM PROCEDURE TRAY) ×3 IMPLANT
POUCH SPECIMEN RETRIEVAL 10MM (ENDOMECHANICALS) ×3 IMPLANT
SCISSORS LAP 5X35 DISP (ENDOMECHANICALS) ×3 IMPLANT
SET CHOLANGIOGRAPH MIX (MISCELLANEOUS) IMPLANT
SET IRRIG TUBING LAPAROSCOPIC (IRRIGATION / IRRIGATOR) ×3 IMPLANT
SET TUBE SMOKE EVAC HIGH FLOW (TUBING) ×3 IMPLANT
SLEEVE XCEL OPT CAN 5 100 (ENDOMECHANICALS) ×6 IMPLANT
SOL ANTI FOG 6CC (MISCELLANEOUS) ×1 IMPLANT
SOLUTION ANTI FOG 6CC (MISCELLANEOUS) ×2
SUT MNCRL AB 4-0 PS2 18 (SUTURE) ×3 IMPLANT
SUT VICRYL 0 UR6 27IN ABS (SUTURE) ×3 IMPLANT
SYR 20ML LL LF (SYRINGE) IMPLANT
SYR CONTROL 10ML LL (SYRINGE) ×3 IMPLANT
TOWEL OR 17X26 10 PK STRL BLUE (TOWEL DISPOSABLE) ×3 IMPLANT
TOWEL OR NON WOVEN STRL DISP B (DISPOSABLE) IMPLANT
TROCAR BLADELESS OPT 5 100 (ENDOMECHANICALS) ×3 IMPLANT
TROCAR XCEL 12X100 BLDLESS (ENDOMECHANICALS) IMPLANT
TROCAR XCEL BLUNT TIP 100MML (ENDOMECHANICALS) ×2 IMPLANT

## 2020-08-28 NOTE — Discharge Instructions (Signed)
[facility] DISCHARGE INSTRUCTIONS  Activity . No heavy lifting greater than 10 pounds for 4 weeks after surgery. Marland Kitchen Ok to shower in 48 hours after surgery, but do not bathe or submerge incisions underwater. . Do not drive while taking narcotic pain medication.  Wound Care . Your incisions are covered with skin glue called Dermabond. This will peel off on its own over time. . You may shower 48 hours after surgery and allow warm soapy water to run over your incisions. Gently pat dry. . Do not submerge your incision underwater. . Monitor your incision for any new redness, tenderness, or drainage.  When to Call us: Marland Kitchen Fever greater than 100.5 . New redness, drainage, or swelling at incision site . Severe pain, nausea, or vomiting . Jaundice (yellowing of the whites of the eyes or skin)  Follow-up You have an appointment scheduled with Dr. Freida Busman on September 16, 2020 at 9:30am. This will be at the [facility] Surgery office at [address], [address], [place], [place]. Please arrive at least 15 minutes prior to your scheduled appointment time.  For questions or concerns, please call the office at [phone].

## 2020-08-28 NOTE — Anesthesia Procedure Notes (Signed)
Procedure Name: Intubation Date/Time: 08/28/2020 4:20 PM Performed by: Minerva Ends, CRNA Pre-anesthesia Checklist: Patient identified, Emergency Drugs available, Suction available and Patient being monitored Patient Re-evaluated:Patient Re-evaluated prior to induction Oxygen Delivery Method: Circle System Utilized Preoxygenation: Pre-oxygenation with 100% oxygen Induction Type: IV induction and Cricoid Pressure applied Ventilation: Mask ventilation without difficulty Laryngoscope Size: Miller and 2 Grade View: Grade III Tube type: Oral Tube size: 7.5 mm Number of attempts: 1 Airway Equipment and Method: Stylet Placement Confirmation: ETT inserted through vocal cords under direct vision,  positive ETCO2 and breath sounds checked- equal and bilateral Secured at: 22 cm Tube secured with: Tape Dental Injury: Teeth and Oropharynx as per pre-operative assessment  Difficulty Due To: Difficulty was anticipated, Difficult Airway- due to anterior larynx and Difficult Airway- due to limited oral opening Future Recommendations: Recommend- induction with short-acting agent, and alternative techniques readily available Comments: CONSIDER DIFFICULT INTUBATION Smooth IV induction Finucane-- intubation AM CRNA atraumatic-- pt with poor dentition upper teeth chipped/left loose-- unchanged with laryngoscopy- bilat BS FinucaneCONSIDER

## 2020-08-28 NOTE — Interval H&P Note (Signed)
History and Physical Interval Note:  08/28/2020 1:20 PM  Mark Kidd  has presented today for surgery, for laparoscopic cholecystectomy. He has a history of gallstone pancreatitis. The H&P was reviewed and there have been no significant clinical changes. COVID negative. Informed consent completed. Proceed to OR, plan for discharge home from PACU.  Sophronia Simas, MD 08/28/20 1:21 PM

## 2020-08-28 NOTE — Anesthesia Preprocedure Evaluation (Addendum)
Anesthesia Evaluation  Patient identified by MRN, date of birth, ID band Patient awake    Reviewed: Allergy & Precautions, NPO status , Patient's Chart, lab work & pertinent test results, reviewed documented beta blocker date and time   Airway Mallampati: II  TM Distance: >3 FB Neck ROM: Full    Dental  (+) Teeth Intact, Dental Advisory Given, Chipped, Poor Dentition, Loose,    Pulmonary neg pulmonary ROS,    Pulmonary exam normal        Cardiovascular hypertension, Pt. on medications and Pt. on home beta blockers  Rhythm:Regular Rate:Normal     Neuro/Psych negative neurological ROS  negative psych ROS   GI/Hepatic Neg liver ROS, PUD, Gallstone pancreatitis   Endo/Other  diabetes, Well Controlled, Type 2, Oral Hypoglycemic Agents  Renal/GU negative Renal ROS  negative genitourinary   Musculoskeletal negative musculoskeletal ROS (+)   Abdominal (+)  Abdomen: soft. Bowel sounds: normal.  Peds  Hematology negative hematology ROS (+)   Anesthesia Other Findings   Reproductive/Obstetrics                          Anesthesia Physical Anesthesia Plan  ASA: II  Anesthesia Plan: General   Post-op Pain Management:    Induction: Intravenous  PONV Risk Score and Plan: 2 and Ondansetron, Dexamethasone and Treatment may vary due to age or medical condition  Airway Management Planned: Mask and Oral ETT  Additional Equipment: None  Intra-op Plan:   Post-operative Plan: Extubation in OR  Informed Consent: I have reviewed the patients History and Physical, chart, labs and discussed the procedure including the risks, benefits and alternatives for the proposed anesthesia with the patient or authorized representative who has indicated his/her understanding and acceptance.     Dental advisory given  Plan Discussed with: CRNA  Anesthesia Plan Comments: (Lab Results      Component                 Value               Date                      WBC                      7.2                 08/27/2020                HGB                      17.1 (H)            08/27/2020                HCT                      49.5                08/27/2020                MCV                      89.7                08/27/2020                PLT  198                 08/27/2020           Lab Results      Component                Value               Date                      NA                       137                 08/27/2020                K                        4.2                 08/27/2020                CO2                      22                  08/27/2020                GLUCOSE                  90                  08/27/2020                BUN                      23                  08/27/2020                CREATININE               0.96                08/27/2020                CALCIUM                  9.6                 08/27/2020                GFRNONAA                 >60                 08/27/2020                GFRAA                    >60                 05/20/2019          )        Anesthesia Quick Evaluation

## 2020-08-28 NOTE — Anesthesia Postprocedure Evaluation (Signed)
Anesthesia Post Note  Patient: Mark Kidd  Procedure(s) Performed: LAPAROSCOPIC CHOLECYSTECTOMY (N/A Abdomen)     Patient location during evaluation: PACU Anesthesia Type: General Level of consciousness: awake and alert Pain management: pain level controlled Vital Signs Assessment: post-procedure vital signs reviewed and stable Respiratory status: spontaneous breathing, nonlabored ventilation, respiratory function stable and patient connected to nasal cannula oxygen Cardiovascular status: blood pressure returned to baseline and stable Postop Assessment: no apparent nausea or vomiting Anesthetic complications: no   No complications documented.  Last Vitals:  Vitals:   08/28/20 1830 08/28/20 1845  BP: (!) 146/88 (!) 152/82  Pulse: 75 74  Resp: (!) 29 (!) 28  Temp:  36.8 C  SpO2: 97% 96%    Last Pain:  Vitals:   08/28/20 1845  PainSc: 0-No pain                 Palmer Shorey DAVID

## 2020-08-28 NOTE — Op Note (Signed)
Date: 08/28/20  Patient: Mark Kidd MRN: [id number]  Preoperative Diagnosis: Cholelithiasis Postoperative Diagnosis: Cholelithiasis, chronic cholecystitis  Procedure: Laparoscopic cholecystectomy  Surgeon: Sophronia Simas, MD  EBL: Minimal  Anesthesia: General  Specimens: Gallbladder  Indications: [name] is a 64 yo male who was hospitalized about a year ago with acute necrotizing pancreatitis. No specific etiology was initially identified, and recent EUS showed cholelithiasis with no biliary ductal abnormalities. Cholecystectomy was recommended for presumed biliary pancreatitis.  Findings: Inflammation of the gallbladder with adhesions, consistent with chronic cholecystitis.  Procedure details: Informed consent was obtained in the preoperative area prior to the procedure. The patient was brought to the operating room and placed on the table in the supine position. General anesthesia was induced and appropriate lines and drains were placed for intraoperative monitoring. Perioperative antibiotics were administered per SCIP guidelines. The abdomen was prepped and draped in the usual sterile fashion. A pre-procedure timeout was taken verifying patient identity, surgical site and procedure to be performed.  A small infraumbilical skin incision was made, the subcutaneous tissue was divided with cautery, and the umbilical stalk was grasped and elevated. The fascia was incised and the peritoneal cavity was directly visualized. A 76mm Hassan trocar was placed. The peritoneal cavity was inspected with no evidence of visceral or vascular injury. Three 2mm ports were placed in the right subcostal margin, all under direct visualization. The fundus of the gallbladder was grasped and retracted cephalad. There were omental adhesions to the gallbladder, which were carefully taken down with blunt dissection and cautery. The infundibulum was retracted laterally. The cystic triangle was dissected out using cautery  and blunt dissection. This was somewhat tedious due to inflammation, and the patient appeared to have a low insertion of the right posterior hepatic duct. A critical view was obtained and the cystic artery was clipped and divided. The cystic duct was clipped high close to the gallbladder and divided. An endoloop was placed on the cystic duct stump. The gallbladder was taken off the liver using cautery. The specimen was placed in an endocatch bag and removed. The surgical site was irrigated with saline until the effluent was clear. Hemostasis was achieved in the gallbladder fossa using cautery. The cystic duct and artery stumps were visually inspected and there was no evidence of bile leak or bleeding. Arista was placed in the gallbladder fossa. The ports were removed under direct visualization and the abdomen was desufflated. The umbilical port site fascia was closed with a 0 vicryl suture. The skin at all port sites was closed with 4-0 monocryl subcuticular suture. Dermabond was applied.  The patient tolerated the procedure well with no apparent complications .All counts were correct x2 at the end of the procedure. The patient was extubated and taken to PACU in stable condition.  Sophronia Simas, MD 08/28/20 5:40 PM

## 2020-08-28 NOTE — Anesthesia Procedure Notes (Signed)
Date/Time: 08/28/2020 5:41 PM Performed by: Minerva Ends, CRNA Oxygen Delivery Method: Simple face mask Placement Confirmation: positive ETCO2 and breath sounds checked- equal and bilateral Dental Injury: Teeth and Oropharynx as per pre-operative assessment

## 2020-08-28 NOTE — Transfer of Care (Signed)
Immediate Anesthesia Transfer of Care Note  Patient: Duquan Gillooly  Procedure(s) Performed: LAPAROSCOPIC CHOLECYSTECTOMY (N/A Abdomen)  Patient Location: PACU  Anesthesia Type:General  Level of Consciousness: sedated  Airway & Oxygen Therapy: Patient Spontanous Breathing and Patient connected to face mask oxygen  Post-op Assessment: Report given to RN and Post -op Vital signs reviewed and stable  Post vital signs: Reviewed and stable  Last Vitals:  Vitals Value Taken Time  BP    Temp    Pulse 83 08/28/20 1747  Resp 16 08/28/20 1747  SpO2 100 % 08/28/20 1747  Vitals shown include unvalidated device data.  Last Pain:  Vitals:   08/28/20 1305  PainSc: 0-No pain         Complications: No complications documented.

## 2020-08-29 ENCOUNTER — Encounter (HOSPITAL_COMMUNITY): Payer: Self-pay | Admitting: Surgery

## 2020-09-01 LAB — SURGICAL PATHOLOGY

## 2023-12-30 ENCOUNTER — Other Ambulatory Visit: Payer: Self-pay | Admitting: Medical Genetics

## 2024-01-02 ENCOUNTER — Other Ambulatory Visit (HOSPITAL_COMMUNITY)
Admission: RE | Admit: 2024-01-02 | Discharge: 2024-01-02 | Disposition: A | Discharge status: Home / Self Care | Payer: Self-pay | Source: Ambulatory Visit | Attending: Oncology | Admitting: Oncology

## 2024-01-10 LAB — GENECONNECT MOLECULAR SCREEN: Genetic Analysis Overall Interpretation: NEGATIVE
